# Patient Record
Sex: Female | Born: 1980 | Race: White | Hispanic: No | Marital: Married | Smoking: Never smoker
Health system: Southern US, Community
[De-identification: ages and names within clinical notes are randomized; demographics above are authoritative.]

## PROBLEM LIST (undated history)

## (undated) DIAGNOSIS — C50411 Malignant neoplasm of upper-outer quadrant of right female breast: Secondary | ICD-10-CM

## (undated) DIAGNOSIS — Z923 Personal history of irradiation: Secondary | ICD-10-CM

## (undated) DIAGNOSIS — L739 Follicular disorder, unspecified: Secondary | ICD-10-CM

## (undated) DIAGNOSIS — F411 Generalized anxiety disorder: Secondary | ICD-10-CM

## (undated) DIAGNOSIS — K529 Noninfective gastroenteritis and colitis, unspecified: Secondary | ICD-10-CM

## (undated) DIAGNOSIS — K635 Polyp of colon: Secondary | ICD-10-CM

## (undated) DIAGNOSIS — Z872 Personal history of diseases of the skin and subcutaneous tissue: Secondary | ICD-10-CM

## (undated) DIAGNOSIS — D179 Benign lipomatous neoplasm, unspecified: Secondary | ICD-10-CM

## (undated) DIAGNOSIS — D492 Neoplasm of unspecified behavior of bone, soft tissue, and skin: Secondary | ICD-10-CM

## (undated) DIAGNOSIS — N83209 Unspecified ovarian cyst, unspecified side: Secondary | ICD-10-CM

## (undated) HISTORY — DX: Follicular disorder, unspecified: L73.9

## (undated) HISTORY — DX: Generalized anxiety disorder: F41.1

## (undated) HISTORY — DX: Neoplasm of unspecified behavior of bone, soft tissue, and skin: D49.2

## (undated) HISTORY — DX: Personal history of diseases of the skin and subcutaneous tissue: Z87.2

## (undated) HISTORY — DX: Malignant neoplasm of upper-outer quadrant of right female breast: C50.411

## (undated) HISTORY — DX: Unspecified ovarian cyst, unspecified side: N83.209

## (undated) HISTORY — DX: Noninfective gastroenteritis and colitis, unspecified: K52.9

## (undated) HISTORY — DX: Polyp of colon: K63.5

## (undated) HISTORY — PX: BREAST LUMPECTOMY: SHX2

## (undated) HISTORY — DX: Benign lipomatous neoplasm, unspecified: D17.9

---
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
## 1998-08-06 DIAGNOSIS — N83209 Unspecified ovarian cyst, unspecified side: Secondary | ICD-10-CM

## 1998-08-06 HISTORY — DX: Unspecified ovarian cyst, unspecified side: N83.209

## 1998-10-08 ENCOUNTER — Emergency Department (HOSPITAL_COMMUNITY): Admission: EM | Admit: 1998-10-08 | Discharge: 1998-10-08 | Payer: Self-pay | Admitting: Emergency Medicine

## 1998-10-09 ENCOUNTER — Encounter: Payer: Self-pay | Admitting: Emergency Medicine

## 1998-10-12 ENCOUNTER — Ambulatory Visit (HOSPITAL_COMMUNITY): Admission: RE | Admit: 1998-10-12 | Discharge: 1998-10-12 | Payer: Self-pay | Admitting: Gastroenterology

## 1998-12-28 ENCOUNTER — Ambulatory Visit (HOSPITAL_COMMUNITY): Admission: RE | Admit: 1998-12-28 | Discharge: 1998-12-28 | Payer: Self-pay | Admitting: Gastroenterology

## 1999-01-05 HISTORY — PX: COLONOSCOPY: SHX174

## 1999-01-25 ENCOUNTER — Emergency Department (HOSPITAL_COMMUNITY): Admission: EM | Admit: 1999-01-25 | Discharge: 1999-01-25 | Payer: Self-pay | Admitting: Emergency Medicine

## 1999-01-26 ENCOUNTER — Encounter: Payer: Self-pay | Admitting: Gastroenterology

## 1999-01-26 ENCOUNTER — Ambulatory Visit (HOSPITAL_COMMUNITY): Admission: RE | Admit: 1999-01-26 | Discharge: 1999-01-26 | Payer: Self-pay | Admitting: Gastroenterology

## 1999-02-04 HISTORY — PX: ESOPHAGOGASTRODUODENOSCOPY: SHX1529

## 1999-02-06 ENCOUNTER — Ambulatory Visit (HOSPITAL_COMMUNITY): Admission: RE | Admit: 1999-02-06 | Discharge: 1999-02-06 | Payer: Self-pay | Admitting: Gastroenterology

## 1999-02-06 ENCOUNTER — Encounter (INDEPENDENT_AMBULATORY_CARE_PROVIDER_SITE_OTHER): Payer: Self-pay | Admitting: Specialist

## 1999-03-15 ENCOUNTER — Ambulatory Visit (HOSPITAL_COMMUNITY): Admission: RE | Admit: 1999-03-15 | Discharge: 1999-03-15 | Payer: Self-pay | Admitting: Gastroenterology

## 1999-03-15 ENCOUNTER — Encounter: Payer: Self-pay | Admitting: Gastroenterology

## 1999-10-05 HISTORY — PX: FLEXIBLE SIGMOIDOSCOPY: SHX1649

## 1999-10-07 ENCOUNTER — Emergency Department (HOSPITAL_COMMUNITY): Admission: EM | Admit: 1999-10-07 | Discharge: 1999-10-07 | Payer: Self-pay | Admitting: *Deleted

## 2000-06-11 ENCOUNTER — Emergency Department (HOSPITAL_COMMUNITY): Admission: EM | Admit: 2000-06-11 | Discharge: 2000-06-11 | Payer: Self-pay | Admitting: Emergency Medicine

## 2000-06-11 ENCOUNTER — Encounter: Payer: Self-pay | Admitting: Emergency Medicine

## 2001-01-14 ENCOUNTER — Emergency Department (HOSPITAL_COMMUNITY): Admission: EM | Admit: 2001-01-14 | Discharge: 2001-01-14 | Payer: Self-pay | Admitting: Emergency Medicine

## 2001-07-04 ENCOUNTER — Other Ambulatory Visit: Admission: RE | Admit: 2001-07-04 | Discharge: 2001-07-04 | Payer: Self-pay | Admitting: Obstetrics and Gynecology

## 2001-10-02 ENCOUNTER — Encounter: Payer: Self-pay | Admitting: Gastroenterology

## 2001-10-02 ENCOUNTER — Encounter: Admission: RE | Admit: 2001-10-02 | Discharge: 2001-10-02 | Payer: Self-pay | Admitting: Gastroenterology

## 2001-10-03 ENCOUNTER — Encounter (INDEPENDENT_AMBULATORY_CARE_PROVIDER_SITE_OTHER): Payer: Self-pay | Admitting: Specialist

## 2001-10-03 ENCOUNTER — Ambulatory Visit (HOSPITAL_COMMUNITY): Admission: RE | Admit: 2001-10-03 | Discharge: 2001-10-03 | Payer: Self-pay | Admitting: Gastroenterology

## 2002-08-06 ENCOUNTER — Encounter: Payer: Self-pay | Admitting: Internal Medicine

## 2002-08-06 ENCOUNTER — Inpatient Hospital Stay (HOSPITAL_COMMUNITY): Admission: EM | Admit: 2002-08-06 | Discharge: 2002-08-08 | Payer: Self-pay | Admitting: Emergency Medicine

## 2002-08-07 ENCOUNTER — Encounter: Payer: Self-pay | Admitting: Internal Medicine

## 2002-10-07 ENCOUNTER — Other Ambulatory Visit: Admission: RE | Admit: 2002-10-07 | Discharge: 2002-10-07 | Payer: Self-pay | Admitting: Obstetrics and Gynecology

## 2004-01-21 ENCOUNTER — Other Ambulatory Visit: Admission: RE | Admit: 2004-01-21 | Discharge: 2004-01-21 | Payer: Self-pay | Admitting: Obstetrics and Gynecology

## 2004-10-16 ENCOUNTER — Ambulatory Visit: Payer: Self-pay | Admitting: Family Medicine

## 2005-01-24 ENCOUNTER — Other Ambulatory Visit: Admission: RE | Admit: 2005-01-24 | Discharge: 2005-01-24 | Payer: Self-pay | Admitting: Obstetrics and Gynecology

## 2006-01-02 ENCOUNTER — Ambulatory Visit: Payer: Self-pay | Admitting: Family Medicine

## 2006-01-14 ENCOUNTER — Ambulatory Visit: Payer: Self-pay | Admitting: Family Medicine

## 2006-02-05 ENCOUNTER — Other Ambulatory Visit: Admission: RE | Admit: 2006-02-05 | Discharge: 2006-02-05 | Payer: Self-pay | Admitting: Obstetrics and Gynecology

## 2006-06-20 ENCOUNTER — Ambulatory Visit: Payer: Self-pay | Admitting: Family Medicine

## 2006-08-14 ENCOUNTER — Ambulatory Visit: Payer: Self-pay | Admitting: Family Medicine

## 2006-08-15 ENCOUNTER — Encounter: Payer: Self-pay | Admitting: Family Medicine

## 2006-10-05 DIAGNOSIS — L739 Follicular disorder, unspecified: Secondary | ICD-10-CM

## 2006-10-05 HISTORY — DX: Follicular disorder, unspecified: L73.9

## 2006-10-20 ENCOUNTER — Inpatient Hospital Stay: Payer: Self-pay | Admitting: Internal Medicine

## 2006-10-20 ENCOUNTER — Other Ambulatory Visit: Payer: Self-pay

## 2006-10-23 ENCOUNTER — Ambulatory Visit: Payer: Self-pay | Admitting: Internal Medicine

## 2006-11-11 ENCOUNTER — Ambulatory Visit: Payer: Self-pay | Admitting: Family Medicine

## 2006-11-12 ENCOUNTER — Encounter: Payer: Self-pay | Admitting: Family Medicine

## 2007-03-07 ENCOUNTER — Ambulatory Visit: Payer: Self-pay | Admitting: Family Medicine

## 2007-03-07 DIAGNOSIS — D649 Anemia, unspecified: Secondary | ICD-10-CM

## 2007-03-07 DIAGNOSIS — K5289 Other specified noninfective gastroenteritis and colitis: Secondary | ICD-10-CM | POA: Insufficient documentation

## 2007-03-07 DIAGNOSIS — F411 Generalized anxiety disorder: Secondary | ICD-10-CM | POA: Insufficient documentation

## 2007-03-07 DIAGNOSIS — L708 Other acne: Secondary | ICD-10-CM | POA: Insufficient documentation

## 2007-03-07 LAB — CONVERTED CEMR LAB
Glucose, Urine, Semiquant: NEGATIVE
Nitrite: NEGATIVE
Specific Gravity, Urine: 1.015
Urobilinogen, UA: 0.2
pH: 6.5

## 2007-03-17 ENCOUNTER — Telehealth (INDEPENDENT_AMBULATORY_CARE_PROVIDER_SITE_OTHER): Payer: Self-pay | Admitting: *Deleted

## 2007-04-05 ENCOUNTER — Inpatient Hospital Stay (HOSPITAL_COMMUNITY): Admission: AD | Admit: 2007-04-05 | Discharge: 2007-04-06 | Payer: Self-pay | Admitting: Obstetrics and Gynecology

## 2007-04-15 ENCOUNTER — Ambulatory Visit: Payer: Self-pay | Admitting: Family Medicine

## 2007-04-16 ENCOUNTER — Encounter: Payer: Self-pay | Admitting: Family Medicine

## 2007-05-14 ENCOUNTER — Ambulatory Visit: Payer: Self-pay | Admitting: Infectious Diseases

## 2007-05-14 ENCOUNTER — Inpatient Hospital Stay (HOSPITAL_COMMUNITY): Admission: AD | Admit: 2007-05-14 | Discharge: 2007-05-17 | Payer: Self-pay | Admitting: Obstetrics and Gynecology

## 2007-05-17 ENCOUNTER — Encounter: Payer: Self-pay | Admitting: Infectious Diseases

## 2007-05-19 ENCOUNTER — Inpatient Hospital Stay (HOSPITAL_COMMUNITY): Admission: AD | Admit: 2007-05-19 | Discharge: 2007-05-19 | Payer: Self-pay | Admitting: Obstetrics and Gynecology

## 2007-05-26 ENCOUNTER — Telehealth: Payer: Self-pay | Admitting: Infectious Diseases

## 2007-08-22 ENCOUNTER — Inpatient Hospital Stay (HOSPITAL_COMMUNITY): Admission: AD | Admit: 2007-08-22 | Discharge: 2007-08-23 | Payer: Self-pay | Admitting: Obstetrics and Gynecology

## 2007-10-09 ENCOUNTER — Observation Stay (HOSPITAL_COMMUNITY): Admission: AD | Admit: 2007-10-09 | Discharge: 2007-10-10 | Payer: Self-pay | Admitting: Obstetrics and Gynecology

## 2007-11-06 ENCOUNTER — Inpatient Hospital Stay (HOSPITAL_COMMUNITY): Admission: AD | Admit: 2007-11-06 | Discharge: 2007-11-09 | Payer: Self-pay | Admitting: Obstetrics and Gynecology

## 2007-11-10 ENCOUNTER — Inpatient Hospital Stay (HOSPITAL_COMMUNITY): Admission: AD | Admit: 2007-11-10 | Discharge: 2007-11-11 | Payer: Self-pay | Admitting: Obstetrics and Gynecology

## 2008-01-20 ENCOUNTER — Other Ambulatory Visit: Payer: Self-pay

## 2008-01-20 ENCOUNTER — Emergency Department: Payer: Self-pay | Admitting: Emergency Medicine

## 2008-02-16 ENCOUNTER — Telehealth: Payer: Self-pay | Admitting: Family Medicine

## 2008-08-13 ENCOUNTER — Ambulatory Visit: Payer: Self-pay | Admitting: Family Medicine

## 2008-11-23 ENCOUNTER — Ambulatory Visit: Payer: Self-pay | Admitting: Family Medicine

## 2008-11-23 ENCOUNTER — Telehealth: Payer: Self-pay | Admitting: Family Medicine

## 2008-11-23 DIAGNOSIS — Z7721 Contact with and (suspected) exposure to potentially hazardous body fluids: Secondary | ICD-10-CM

## 2008-11-26 LAB — CONVERTED CEMR LAB
Basophils Absolute: 0 10*3/uL (ref 0.0–0.1)
Hemoglobin: 13.5 g/dL (ref 12.0–15.0)
Lymphocytes Relative: 33.9 % (ref 12.0–46.0)
MCV: 86.9 fL (ref 78.0–100.0)
Monocytes Absolute: 0.3 10*3/uL (ref 0.1–1.0)
Neutro Abs: 2.1 10*3/uL (ref 1.4–7.7)
Neutrophils Relative %: 55.6 % (ref 43.0–77.0)
Platelets: 250 10*3/uL (ref 150.0–400.0)
RBC: 4.41 M/uL (ref 3.87–5.11)
RDW: 13.3 % (ref 11.5–14.6)

## 2009-01-03 IMAGING — US US OB COMP LESS 14 WK
1 series · 14 of 28 positions shown · non-contrast
Comparison: none

HISTORY: Early pregnancy, spotting

[Series 1: us ob comp less 14 wk · 0.18mm/px · 14 of 39 slices shown]
[im 2/39]
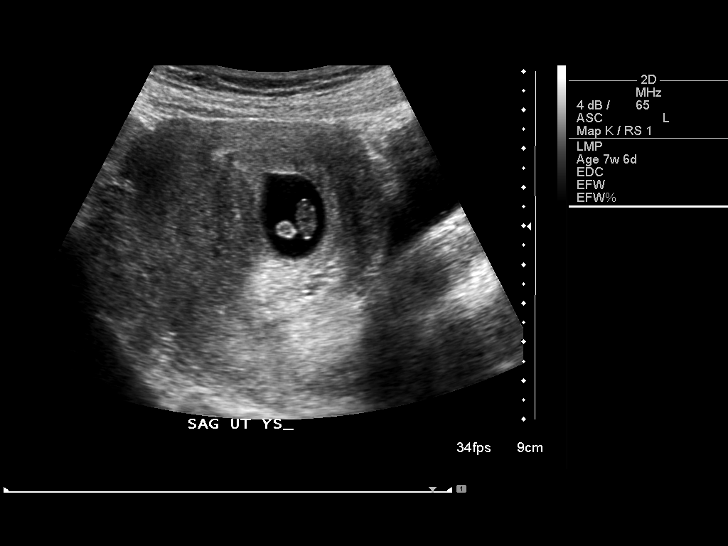
[im 5/39]
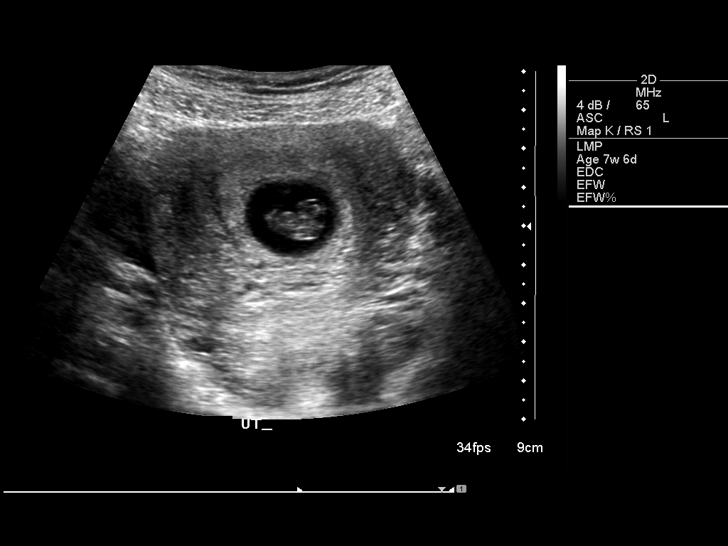
[im 8/39]
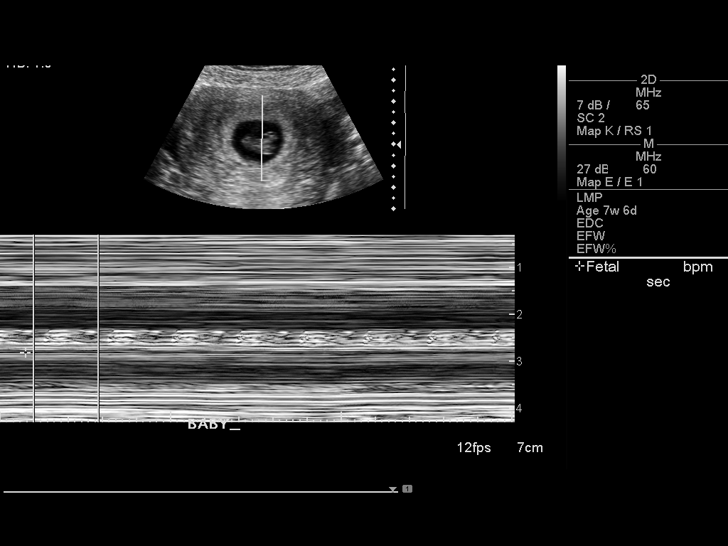
[im 10/39]
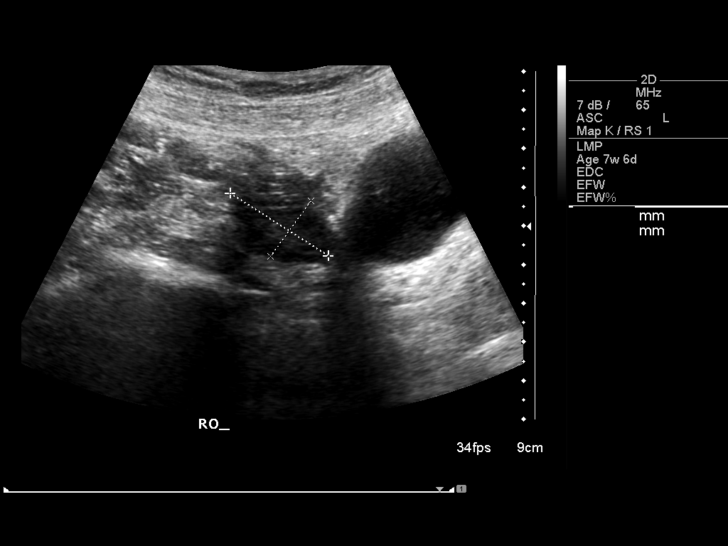
[im 13/39]
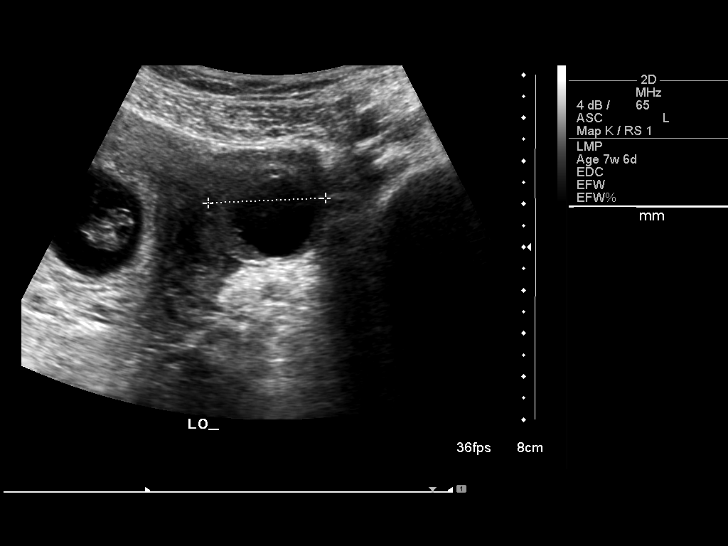
[im 16/39]
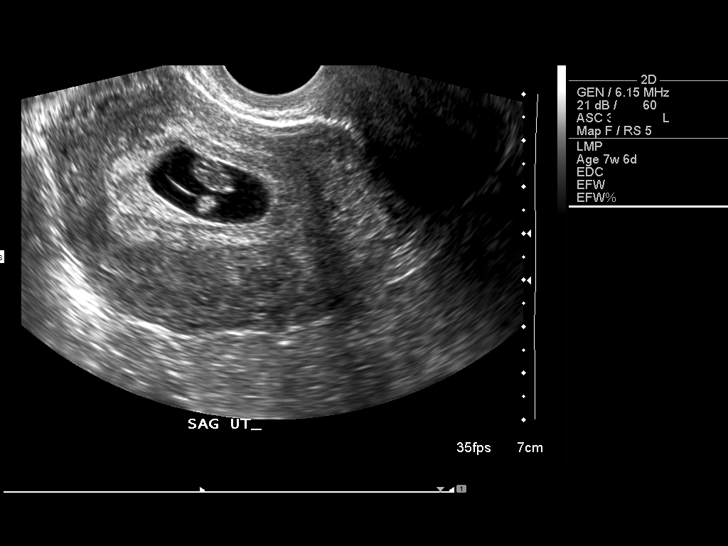
[im 19/39]
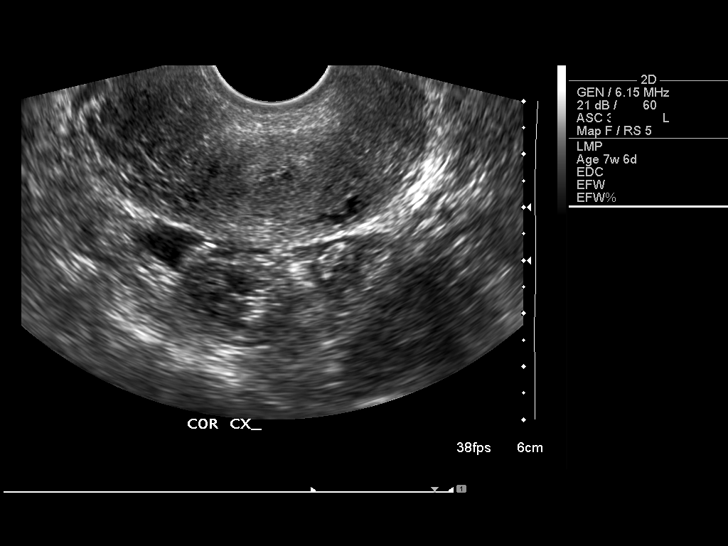
[im 22/39]
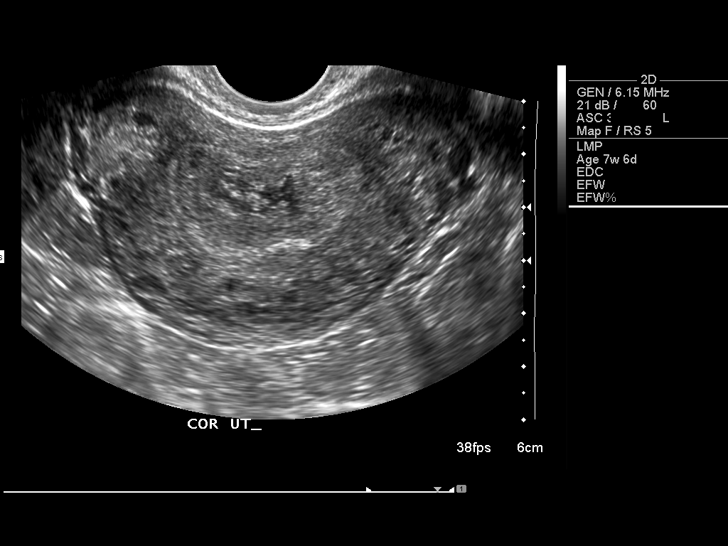
[im 24/39]
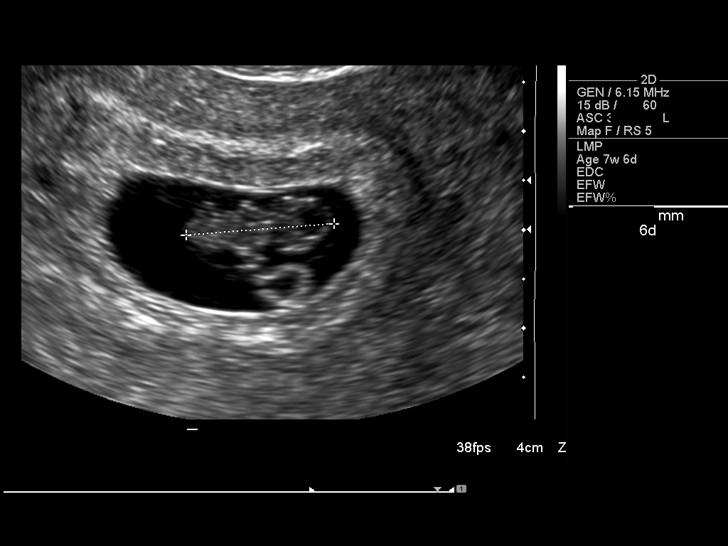
[im 27/39]
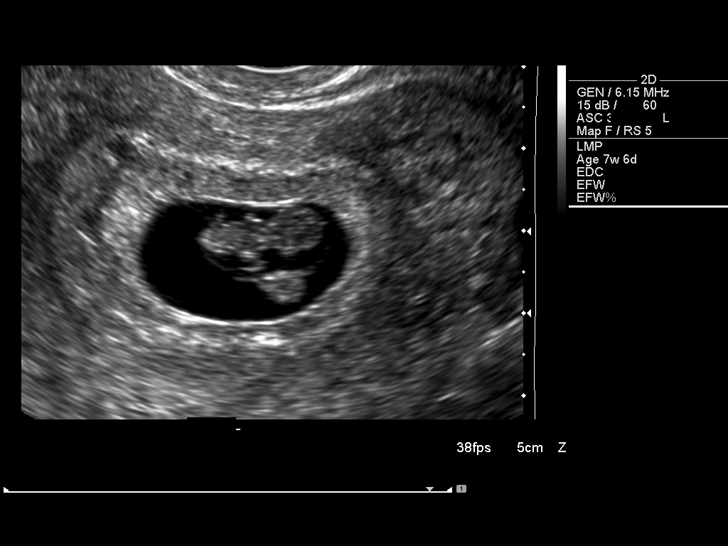
[im 30/39]
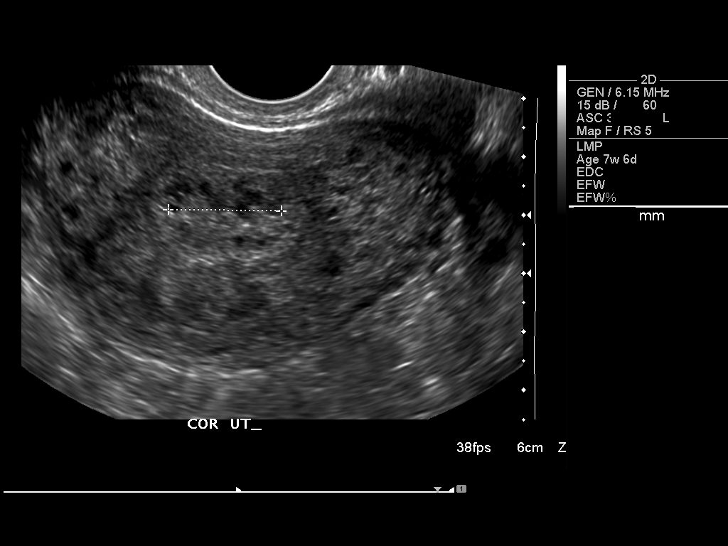
[im 33/39]
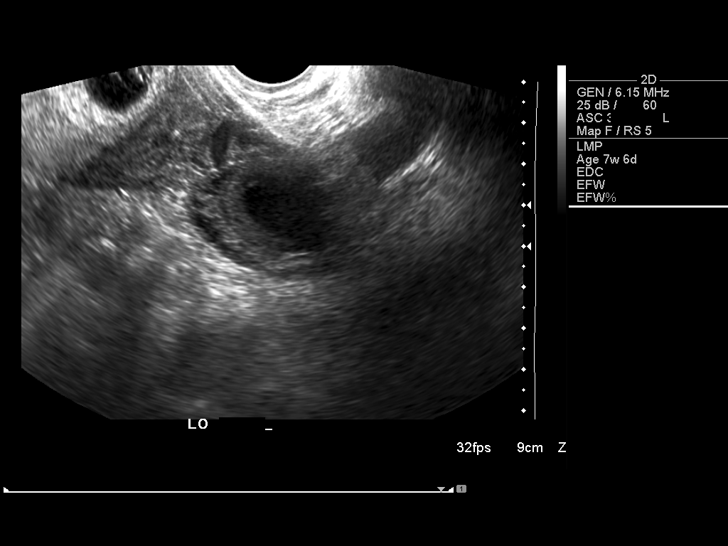
[im 36/39]
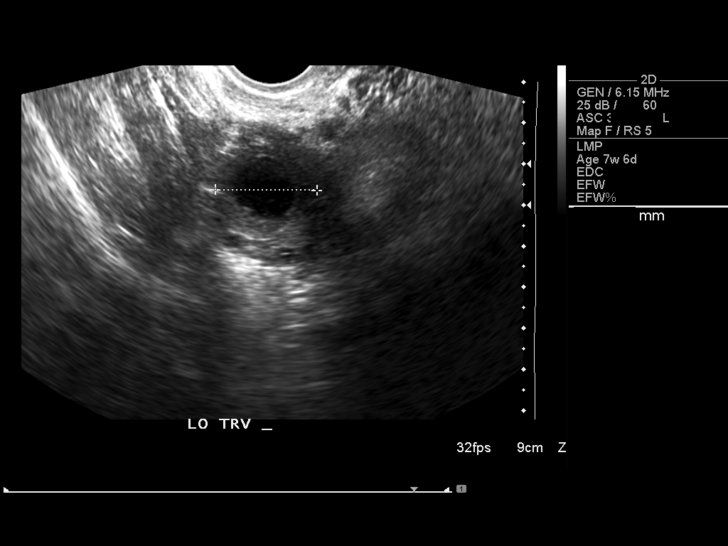
[im 39/39]
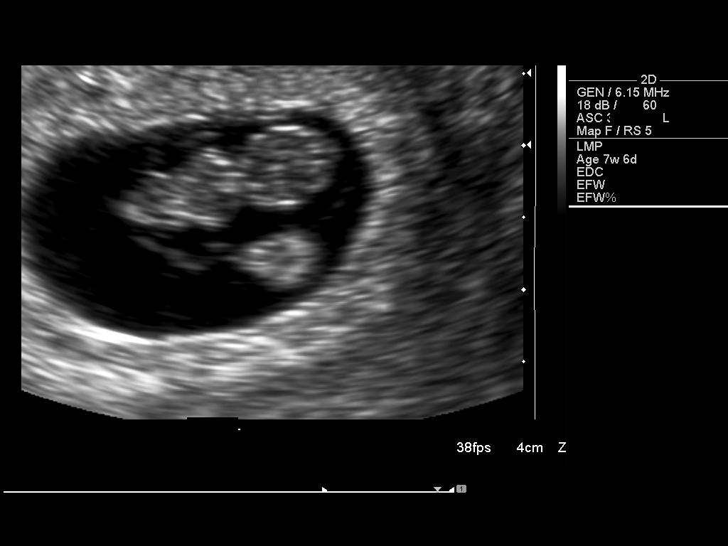

[14 of 28 positions shown; findings below may reference images not displayed]

ULTRASOUND OB COMPLETE LESS THAN 14 WEEKS:
ULTRASOUND OB TRANSVAGINAL MODIFY:

Transabdominal and endovaginal sonography of the gravid pelvis performed.

Gestational sac seen within uterus containing fetal pole and yolk sac.
Crown-rump length 15.6 mm corresponding to 8 weeks 0 days EGA.
Small subchorionic hemorrhage identified
Fetal cardiac activity detected at 158 beats per minute.
Right ovary normal size and morphology, 3.2 x 1.8 x 2.9 cm.
Left ovary 4.2 x 2.9 x 2.5 cm, containing small corpus luteal cyst.
No free pelvic fluid or adnexal mass.
IMPRESSION: Single live early intrauterine gestation measured at 8 weeks 0 days EGA by
crown-rump length.
Small subchorionic hemorrhage.

## 2009-09-06 ENCOUNTER — Telehealth: Payer: Self-pay | Admitting: Family Medicine

## 2010-02-03 ENCOUNTER — Ambulatory Visit: Payer: Self-pay | Admitting: Family Medicine

## 2010-02-03 DIAGNOSIS — R21 Rash and other nonspecific skin eruption: Secondary | ICD-10-CM

## 2010-09-04 ENCOUNTER — Ambulatory Visit
Admission: RE | Admit: 2010-09-04 | Discharge: 2010-09-04 | Payer: Self-pay | Source: Home / Self Care | Attending: Family Medicine | Admitting: Family Medicine

## 2010-09-04 DIAGNOSIS — D179 Benign lipomatous neoplasm, unspecified: Secondary | ICD-10-CM | POA: Insufficient documentation

## 2010-09-05 ENCOUNTER — Ambulatory Visit
Admission: RE | Admit: 2010-09-05 | Discharge: 2010-09-05 | Payer: Self-pay | Source: Home / Self Care | Attending: Family Medicine | Admitting: Family Medicine

## 2010-09-05 ENCOUNTER — Encounter: Payer: Self-pay | Admitting: Family Medicine

## 2010-09-05 DIAGNOSIS — D492 Neoplasm of unspecified behavior of bone, soft tissue, and skin: Secondary | ICD-10-CM | POA: Insufficient documentation

## 2010-09-06 ENCOUNTER — Encounter: Payer: Self-pay | Admitting: Family Medicine

## 2010-09-06 ENCOUNTER — Ambulatory Visit: Payer: Self-pay | Admitting: Family Medicine

## 2010-09-06 DIAGNOSIS — R609 Edema, unspecified: Secondary | ICD-10-CM | POA: Insufficient documentation

## 2010-09-07 NOTE — Progress Notes (Signed)
Summary: ?flu N&V  Phone Note Call from Patient Call back at [phone]   Caller: Patient Call For: Melissa Part MD Summary of Call: Pt left v/m that she has the flu, nausea and vomiting, can't eat or drink. I called pt and left v/m to please call me back. Initial call taken by: Lewanda Rife LPN,  September 06, 2009 2:42 PM  Follow-up for Phone Call        Pt was exposed to flu(pt's family has had confirmed flu).Pt has been sick one wk. Productive cough with green mucus,ST, fever 101-103 pt rotating Tylenol and Advil and temp now is normal.Last vomitied 11am this morning. still nauseate but is able to drink and keep down sprite and water. Occasional diarrhea. Ate small amt for lunch and kept that down but still nauseated. Pt feels weak. Pt taking mucinex D, tylenol or advil and pt had liquid Augmentin and was taking 11ml not sure of mg. Pt stopped because thought that might upset stomach. Pt feels a little better today and wondered if could get Phenergan called in and see how she does. Pt would rather not come in if did not have to. Pt uses [facility] [address]. Please advise.  Follow-up by: Lewanda Rife LPN,  September 06, 2009 3:42 PM  Additional Follow-up for Phone Call Additional follow up Details #1::        ok to try some phenergan -- watch out of sedation if abd pain or return of fever - update me and f/u if not better in several days f/u please px written on EMR for call in  her record shows adv rxn to phenergan in past-- but only local rxn with injection only-- please confirm that she has no prev prob with oral phenergan Additional Follow-up by: Melissa Part MD,  September 06, 2009 3:49 PM    Additional Follow-up for Phone Call Additional follow up Details #2::    Rx called in to [facility], left message on patient's cell phone voicemail, advised as instructed.  Also asked her to return call.  Linde Gillis CMA Duncan Dull)  September 06, 2009 4:38 PM   Message left for pt  this morning to call back with allergy information.  She has not called back yet.            Lowella Petties CMA  September 07, 2009 2:43 PM  Pt called stating that she is not allergic to oral phenergan and feels much better now. Follow-up by: Lowella Petties CMA,  September 07, 2009 3:56 PM  New/Updated Medications: PROMETHAZINE HCL 25 MG TABS (PROMETHAZINE HCL) 1 by mouth up to three times a day as needed nausea Prescriptions: PROMETHAZINE HCL 25 MG TABS (PROMETHAZINE HCL) 1 by mouth up to three times a day as needed nausea  #10 x 0   Entered and Authorized by:   Melissa Part MD   Signed by:   Melissa Part MD on 09/06/2009   Method used:   Telephoned to ...         RxID:   [id number]

## 2010-09-07 NOTE — Assessment & Plan Note (Signed)
Summary: ACUTE BUMPS ON ARMS AND THIGH  CYD   Vital Signs:  Patient profile:   30 year old female Height:      65 inches Weight:      139.2 pounds BMI:     23.25 Temp:     99.0 degrees F Pulse rate:   80 / minute Pulse rhythm:   regular BP sitting:   90 / 60  (left arm) Cuff size:   regular  Vitals Entered By: Benny Lennert CMA Duncan Dull) (February 03, 2010 4:27 PM) CC: rash on body   History of Present Illness: 4 days ago noted onset on rash on back.,.Marland Kitchenafter pulling weeds in yard. Next AM noted bumps on feet...more amd more spread to arms. Small reddish bumps, no blisters.   Fever today 100. Feels tired and mild diarrhea today.  Very itchy...most at night.  HAs been puting alcholol and fingernail poilish on them.   No antihistamines.  No sick contacts. No new meds, no new detergent, no new creams.  Problems Prior to Update: 1)  Exposure To Hazardous Body Fluid, Hx of  (ICD-V15.85) 2)  Anxiety State Nos  (ICD-300.00) 3)  Hx of Acne Vulgaris  (ICD-706.1) 4)  Hx of Colitis  (ICD-558.9) 5)  Anemia-nos  (ICD-285.9)  Current Medications (verified): 1)  Tri-Sprintec 0.035 Mg Tabs (Norgestimate-Ethinyl Estradiol) .... One By Mouth Daily As Directed 2)  Triamcinolone Acetonide 0.5 % Crea (Triamcinolone Acetonide) .... Aaa Two Times A Day X 2 Weeks  Allergies: 1)  Erythromycin 2)  Lidocaine 3)  Phenergan 4)  Zithromax 5)  * Ferrous Sulfate 6)  Codeine  Past History:  Past medical, surgical, family and social histories (including risk factors) reviewed, and no changes noted (except as noted below).  Past Medical History: Reviewed history from 08/13/2008 and no changes required. Anemia-NOS hx of colitis acne   Past Surgical History: Reviewed history from 03/07/2007 and no changes required. Tonsillectomy (1998) CT abd- adnexal mass, ovarian cyst (2000) Colonoscopy- few polyps- bx neg (01/1999) EGD- neg (02/1999) Flex sig- neg (10/1999) Hosp- urticaria  (08/2002) Hosp- folliculitis  (pseudomonas), anemia (10/2006)  Family History: Reviewed history and no changes required.  Social History: Reviewed history from 11/23/2008 and no changes required. Marital Status: Married Children: 1 Occupation:   Review of Systems General:  Complains of malaise. CV:  Denies chest pain or discomfort. Resp:  Denies shortness of breath.  Physical Exam  General:  Well-developed,well-nourished,in no acute distress; alert,appropriate and cooperative throughout examination Eyes:  No corneal or conjunctival inflammation noted. EOMI. Perrla. Funduscopic exam benign, without hemorrhages, exudates or papilledema. Vision grossly normal. Ears:  External ear exam shows no significant lesions or deformities.  Otoscopic examination reveals clear canals, tympanic membranes are intact bilaterally without bulging, retraction, inflammation or discharge. Hearing is grossly normal bilaterally. Nose:  External nasal examination shows no deformity or inflammation. Nasal mucosa are pink and moist without lesions or exudates. Mouth:  Oral mucosa and oropharynx without lesions or exudates.  Teeth in good repair. Neck:  no carotid bruit or thyromegaly no cervical or supraclavicular lymphadenopathy  Lungs:  Normal respiratory effort, chest expands symmetrically. Lungs are clear to auscultation, no crackles or wheezes. Heart:  Normal rate and regular rhythm. S1 and S2 normal without gallop, murmur, click, rub or other extra sounds. Skin:  several small clusters of erythematous papules on arms leg and torso   Impression & Recommendations:  Problem # 1:  SKIN RASH (ICD-782.1) Most likely contact dermatitis, unclear triggger. Low grade temperature  may suggest viral source, but rash ois not consistent. . Treat with topical steroid and antihistamines.  Her updated medication list for this problem includes:    Triamcinolone Acetonide 0.5 % Crea (Triamcinolone acetonide) .Marland Kitchen... Aaa two  times a day x 2 weeks  Complete Medication List: 1)  Tri-sprintec 0.035 Mg Tabs (Norgestimate-ethinyl estradiol) .... One by mouth daily as directed 2)  Triamcinolone Acetonide 0.5 % Crea (Triamcinolone acetonide) .... Aaa two times a day x 2 weeks  Patient Instructions: 1)  Benadryl at night for sleep. 2)  Claritin during day for itching. 3)  Apply topical steroid cream two times a day x 2 weeks.  Prescriptions: TRIAMCINOLONE ACETONIDE 0.5 % CREA (TRIAMCINOLONE ACETONIDE) AAA two times a day x 2 weeks  #15-30 gm x 0   Entered and Authorized by:   Kerby Nora MD   Signed by:   Kerby Nora MD on 02/03/2010   Method used:   Electronically to        [facility] [address] (retail)       [address]       [place], [place]  [address]       Ph: [phone]       Fax: [phone]   RxID:   [id number]   Current Allergies (reviewed today): ERYTHROMYCIN LIDOCAINE PHENERGAN ZITHROMAX * FERROUS SULFATE CODEINE

## 2010-09-13 NOTE — Assessment & Plan Note (Signed)
Summary: Recheck leg-per Dr. G//kad   Vital Signs:  Patient profile:   30 year old female Weight:      135.50 pounds Temp:     98.4 degrees F oral Pulse rate:   104 / minute Pulse rhythm:   regular BP sitting:   138 / 80  (left arm) Cuff size:   regular  Vitals Entered By: Selena Batten Dance CMA Duncan Dull) (September 05, 2010 4:53 PM) CC: Discuss U/S   History of Present Illness: CC: f/u US results  Pt returns today extremely worried about "prominent vein" noted on ultrasound.  wants furthe discussion as to what this could be.  still denies injury/trauma to leg.  last visit-  7 years ago s/p [place] R thigh, told fatty mass.  had massage last week, noticing swelling more as well as thinks it's grown, also noticing 2nd spot medial and proximal to first spot.  tender, bothersome.  worrying alot about this change.  flies to [employer] (2x/mo) - wants to make sure it's not a blood clot.  does walk plane a few times during long flights.    Allergies: 1)  Erythromycin 2)  Lidocaine 3)  Phenergan 4)  Zithromax 5)  * Ferrous Sulfate 6)  Codeine  Review of Systems       per HPI  Physical Exam  General:  Well-developed,well-nourished,in no acute distress; alert,appropriate and cooperative throughout examination Msk:  R anterior thigh at subcutaneous tissue level small tender nodule, somewhat mobile.  about 1cm in diameter.  also 2nd smaller (mms) lump medial/proximal to first Psych:  in tears, anxious about finding.   Impression & Recommendations:  Problem # 1:  NEOPLASMS UNSPEC NATURE BONE SOFT TISSUE&SKIN (ICD-239.2) doesn't feel like typical lipoma, rather like inflammed cyst/nodule/scar tissue.  "prominent vein" at site but patent per [place] report.  ? noninfectious phlebitis?  discussed with patient ,tried to reassure.  rec NSAIDs, cold compress for now.  plan is MRI tomorrow, appt with surgery on Friday.  If more vein issue, may need eval by vasc surgery  Orders: No Charge Patient  Arrived (NCPA0) (NCPA0)  Problem # 2:  LIPOMA (ICD-214.9)  Complete Medication List: 1)  Tri-sprintec 0.035 Mg Tabs (Norgestimate-ethinyl estradiol) .... One by mouth daily as directed   Orders Added: 1)  No Charge Patient Arrived (NCPA0) [NCPA0]    Current Allergies (reviewed today): ERYTHROMYCIN LIDOCAINE PHENERGAN ZITHROMAX * FERROUS SULFATE CODEINE

## 2010-09-13 NOTE — Assessment & Plan Note (Signed)
Summary: knot on leg/alc   Vital Signs:  Patient profile:   30 year old female Weight:      135.50 pounds Temp:     98.2 degrees F oral Pulse rate:   78 / minute Pulse rhythm:   regular BP sitting:   116 / 72  (left arm) Cuff size:   regular  Vitals Entered By: Selena Batten Dance CMA Duncan Dull) (September 04, 2010 3:55 PM) CC: Check knot on Right leg Comments Patient is seeing Dr. Barbaraann Cao at [facility] on 09-08-10. [phone] phone and [phone] fax   History of Present Illness: CC: check knot R leg  7 years ago s/p [place] R thigh, told fatty mass.  had massage last week, noticing swelling more as well as thinks it's grown, also noticing 2nd spot medial to first spot.  tender, bothersome.  worrying alot about this change.  flies to [employer] (2x/mo) - wants to make sure it's not a blood clot.  does walk plane a few times during long flights.  Allergies: 1)  Erythromycin 2)  Lidocaine 3)  Phenergan 4)  Zithromax 5)  * Ferrous Sulfate 6)  Codeine  Past History:  Past Medical History: Last updated: 08/13/2008 Anemia-NOS hx of colitis acne   Social History: Last updated: 11/23/2008 Marital Status: Married Children: 1 Occupation:   Review of Systems       per HPI  Physical Exam  General:  Well-developed,well-nourished,in no acute distress; alert,appropriate and cooperative throughout examination Msk:  R anterior thigh at subcutaneous tissue level small tendern nodule, somewhat mobile.  about 1cm in diameter.  also 2nd smaller (mms) lump medial/proximal to first Extremities:  no palpable cords.  no edema Skin:  Intact without suspicious lesions or rashes   Impression & Recommendations:  Problem # 1:  LIPOMA (ICD-214.9) refer back for [place] for changing soft tissue mass.  doesn;t feel like typical lipoma, rather like inflammed cyst/nodule/scar tissue.  pt already has appt with surgery for further evaluation, advised to keep, and to ask for records of [place] to take to  [facility].  Orders: Radiology Referral (Radiology)  Complete Medication List: 1)  Tri-sprintec 0.035 Mg Tabs (Norgestimate-ethinyl estradiol) .... One by mouth daily as directed  Patient Instructions: 1)  We will set you up with ultrasound. 2)  keep appointment with surgeon for Friday. 3)  Let us know if any questions!  good to meet you today.   Orders Added: 1)  Radiology Referral [Radiology] 2)  Est. Patient Level III [30865]    Current Allergies (reviewed today): ERYTHROMYCIN LIDOCAINE PHENERGAN ZITHROMAX * FERROUS SULFATE CODEINE

## 2010-12-19 NOTE — Discharge Summary (Signed)
Melissa Weiss, Melissa Weiss                ACCOUNT NO.:  [id number]   MEDICAL RECORD NO.:  [id number]          PATIENT TYPE:  INP   LOCATION:  9149                          FACILITY:  [facility]   PHYSICIAN:  Naima A. Dillard, M.D. DATE OF BIRTH:  02/24/1981   DATE OF ADMISSION:  10/09/2007  DATE OF DISCHARGE:  10/10/2007                               DISCHARGE SUMMARY   ADMITTING DIAGNOSES:  1. Intrauterine pregnancy at 34-5/7 weeks.  2. Nausea, occasional vomiting and Crohn's disease.  3. Preterm uterine contractions.   DISCHARGE DIAGNOSES:  1. Intrauterine pregnancy at 34-5/7 weeks.  2. Nausea, occasional vomiting and Crohn's disease.  3. Preterm uterine contractions.   PROCEDURES:  IV hydration.   HOSPITAL COURSE:  Ms. Lalli is a 30 year old gravida 1, para 0 at 34-4/7  weeks who presented from the office on October 09, 2007, with a complaint  of uterine contractions, lower abdominal pain, nausea with occasional  vomiting.  Her pregnancy had been remarkable for:  1. Crohn's disease.  2. History of HSV II  3. Facial MRSA.  4. First trimester bleeding.  5. History of pyelo and UTI with admission to the hospital for pyelo      during this pregnancy requiring hospital care.   On admission, vital signs were stable except for pulse rate of 114.  The  patient did appear to fill ill.  She is having some mild lower abdominal  tenderness.  Uterine contractions every 6 minutes with irritability  between.  Fetal heart rate is reactive with no decelerations.  Cervix  was closed and long.  The patient was noted to be significantly  dehydrated by urine examination.  She was given one dose of Ancef in  light of her history of pyelo and also recent positive urine cultures  for group B strep as well as enterococcus.  The patient did had had a  test of cure sent from the office culture on the day of admission.  She  also had further antiemetics.  She also to have gallbladder ultrasound  that showed mild to  moderate right hydronephrosis consistent with  pregnancy and mild intrahepatic hepatic biliary fullness with normal  common bile duct caliber.  There were no evidence of stones.  The  patient was given one dose of Procardia 30 mg which seemed to diminish  her contractions.  She was admitted overnight for IV hydration.  She  declined antiemetics and Fleet medication.  Her cervix did not change.  By the morning of October 10, 2007, the patient was feeling much better.  She was having no further nausea.  She was tolerating a regular diet.  Fetal heart rate was reactive.  She was having minimal contraction  activity.  She was seen by Dr. Normand Sloop and was deemed to receive full  benefit of her hospital stay and was discharged home.  During the  patient's hospital stay, she also had a social work consult for anxiety,  but was found to have no underlying disease.   DISCHARGE INSTRUCTIONS:  The patient is to continue to monitor for any  increase in  pain, nausea, vomiting or any other issues of premature  labor.  She will continue monitoring her Crohn's disease.  Discharge  follow-up will occur in one week at [facility] or p.r.n.      Renaldo Reel Emilee Hero, C.N.M.      Naima A. Normand Sloop, M.D.  Electronically Signed    VLL/MEDQ  D:  10/10/2007  T:  10/12/2007  Job:  [id number]

## 2010-12-19 NOTE — H&P (Signed)
NAMEDEANZA, UPPERMAN                ACCOUNT NO.:  [id number]   MEDICAL RECORD NO.:  [id number]          PATIENT TYPE:  INP   LOCATION:  9149                          FACILITY:  [facility]   PHYSICIAN:  Hal Morales, M.D.DATE OF BIRTH:  02/13/1981   DATE OF ADMISSION:  10/09/2007  DATE OF DISCHARGE:                              HISTORY & PHYSICAL   Ms. Cataldo is a 30 year old gravida 1, para 0, at 34-4/7 weeks, who  presented from the office for evaluation secondary to uterine  contractions on NST.  She has complained of decreased fetal movement and  lower abdominal pain.  NST was reactive but uterine contractions were  noted to be every 3 minutes on the monitor.  The patient was complaining  of just feeling bad.  She was nauseated, had low appetite this week.  No vomiting or diarrhea and also denied fever or chills.  She is aware  of uterine contraction that is painful but also complains of general  lower abdominal pain since Monday with symptoms of nausea.  She had not  eaten in 24 hours.  Cervix in the office was closed and long, -2 in the  office per Dr. Estanislado Pandy.  Pregnancy has been remarkable for:   1. Crohn disease.  2. History of HSV-2, no recent outbreaks.  3. History of facial MRSA.  4. First trimester bleeding.  5. History of pyelonephritis and UTI with pyelonephritis during this      pregnancy with hospitalization required on October 8.   PRENATAL LABS:  Blood type is A+, Rh antibody negative.  VDRL  nonreactive.  Rubella titer positive.  Hepatitis B surface antigen  negative.  HIV was nonreactive.  GC and chlamydia cultures were negative  in the first trimester.  Cystic fibrosis testing was negative.  First  trimester screening was normal.  She had MRSA on her face and was  treated several times.  AFP was normal.  Quad screen was done, not  intentionally, but did show an elevated risk of Down's.  First trimester  screen was normal.  Amniocentesis was not recommended.   Glucola was  normal.  Hemoglobin at 28 weeks was 12.2.  The patient had positive  group B strep noted on urine culture and was treated with penicillin VK  and a urinary culture was Enterococcus was treated with ampicillin.   HISTORY OF PRESENT PREGNANCY:  The patient entered care at approximately  10 weeks.  First trimester screen was done.  She had been seen by Dr.  Estanislado Pandy just prior to that for clindamycin treatment of MRSA infection on  her face.  She had episodes throughout her pregnancy of hematuria.  She  had pyelonephritis noted in October and was admitted overnight for  several days in the hospital for that.  At 15 weeks she had an  ultrasound showing normal growth.  She continued be followed for her  MRSA by Dr. Ninetta Lights.  She had another ultrasound at 18 weeks showing  normal growth.  She had a normal AFP but unintentionally had a quadruple  screen run as well, which showed an  elevated Down syndrome risk.  The  risk of first trimester screen was 1/15,000.  Amniocentesis was offered  but Dr. Stefano Gaul recommended the patient not to amniocentesis.  She had  some cramping at 19 weeks with some questionable bleeding.  It was  determined this probably was from the rectal area.  During pregnancy she  has had some issues with nausea and vomiting, abdominal pain.  She had  another ultrasound at 24 weeks showing normal growth and a repeat of  that was done at 28-29 weeks.  At 28 weeks she also had a episode of  racing heartbeat.  TSH was evaluated and was normal.  She had a Group B  strep culture done approximately 2-1/2 weeks ago that was positive as  she was treated for this to penicillin VK because it was a urine  culture.  Then she had had a previously subsequent urine culture that  showed Enterococcus, and this was treated with ampicillin   OBSTETRICAL HISTORY:  The patient is a primigravida.   MEDICAL HISTORY:  She is a previous oral contraceptive/Sprintec user.  She was diagnosed  with HSV-2 in the past.  She was diagnosed with GC in  2007 and it was treated.  She was diagnosed with MRSA on her face in  April 2008 and was treated.  She had sporadic UTIs.  She has had  pyelonephritis several times.  She does have a history of Crohn disease.  This was diagnosed in college.   SURGICAL HISTORY:  Wisdom teeth removed, tonsils removed, adenoids.   She has had several episodes of rectal bleeding.  She had a  sigmoidoscopy and endoscopy and colonoscopy related to Crohn's.  She had  a Pseudomonas encephalitis for 3 days and had fifth disease in the past.   FAMILY HISTORY:  Maternal grandfather had congestive heart failure and  quadruple bypass surgery.  Her father has angina.  Her mother also has  angina.  Her mother and father and maternal uncle have hypertension.  Her mother and maternal aunt have varicosities.  Her sister has asthma.  Her paternal grandmother had strokes and Alzheimer's.  Her grandfather  had Alzheimer's.  Her mother, maternal grandmother and maternal aunt and  the patient have all had anemia.  Paternal grandfather has a  nonfunctioning left kidney.  Paternal grandfather had prostate cancer.  Paternal grandfather had severe depression.  Paternal first cousin was  born with the heart outside the chest.   GENERAL:  Otherwise unremarkable.   SOCIAL HISTORY:  The patient is married to the father of the baby.  He  is involved and supportive.  His name is Falyn Rubel.  The patient is an  audiologist.  She has a bachelor's degree and is full-time employed.  Her husband has 1 year college.  He is a Production designer, theatre/television/film, Occupational hygienist.   The patient is allergic to CODEINE, ERYTHROMYCIN, DEMEROL, VICODIN,  which cause vomiting; LIDOCAINE and BENADRYL, which cause reaction.   The patient does any alcohol, drug or tobacco use during this pregnancy.  She is Caucasian and does not declare a religious affiliation.  She has  been followed by the physician service at  [facility].  She  denies any alcohol, drug or tobacco use during this pregnancy.   PHYSICAL EXAM:  VITAL SIGNS:  Stable.  The patient is afebrile.  Pulse is 114.  The patient did appear to feel  ill.  HEART:  Regular rate and rhythm without murmur.  BREASTS:  Soft and  nontender.  ABDOMEN:  Fundal height is approximately 34 cm.  Abdomen is gravid with  mild tenderness.  Fetal heart rate was reactive on admission with no  decelerations.  Uterine contractions are every 6 minutes with  irritability between.  Cervix was closed and long.  Fetal fibronectin was deferred by Dr.  Estanislado Pandy in the office.  EXTREMITIES:  Deep to reflexes are 2+ without clonus.  There is a trace  edema noted.   UA shows specific gravity 1.010, moderate hemoglobin, ketones 40 and  small leukocyte esterase.  Urine was sent for culture.  The patient  received a dose of Ancef 2 g IV in MAU and Zofran.  CBC showed  hemoglobin of 12.2, white blood cell count of 9.4 and platelet count of  169.  Comprehensive metabolic panel was within normal limits.   The patient received one dose of 30 mg of Procardia in maternity  admissions with diminishing of contractions.  Cervix was rechecked and  had no change.  Pulse remained at 122.  The decision was made to admit  for 23-hour observation, IV hydration and medication for nausea.   ASSESSMENT:  1. Intrauterine pregnancy at 34-4/7 weeks.  2. Crohn disease.  3. Preterm uterine contractions without cervical change..  4. Nausea and abdominal pain.   PLAN:  1. Admit to antenatal per consult with Dr. Pennie Rushing as attending      physician.  2. IV hydration.  3. Zofran IV q.8 h. p.r.n.  4. The patient was offered and recommended Ambien; however, she      declined.  5. Procardia will be given if necessary for contraction control.  6. Social work consult in the morning per Dr. Cloretta Ned recommendation      secondary to anxiety.      Renaldo Reel Emilee Hero, C.N.M.       Hal Morales, M.D.  Electronically Signed    VLL/MEDQ  D:  10/10/2007  T:  10/10/2007  Job:  [id number]

## 2010-12-19 NOTE — Discharge Summary (Signed)
[name], [name]                ACCOUNT NO.:  [id number]   MEDICAL RECORD NO.:  [id number]          PATIENT TYPE:  INP   LOCATION:  9320                          FACILITY:  [facility]   PHYSICIAN:  Melissa Weiss, M.D.DATE OF BIRTH:  May 29, 1981   DATE OF ADMISSION:  05/13/2007  DATE OF DISCHARGE:  05/17/2007                               DISCHARGE SUMMARY   ADMISSION DIAGNOSES:  1. Intrauterine pregnancy at 13 weeks and 0 days gestation.  2. Pyelonephritis.  3. Methicillin resistant Staphylococcus aureus infection of the left      facial cheek.   DISCHARGE DIAGNOSES:  1. Intrauterine pregnancy at 13-4/7 weeks.  2. Pyelonephritis resolving.  3. Methicillin resistant Staphylococcus aureus cellulitis of left      cheek, improved.   PROCEDURE:  Intravenous antibiotics.   HOSPITAL COURSE:  Ms. Deakins is a 30 year old gravida 1, para 0 who  presents at 13-0/[redacted] weeks gestation with complaint of CVA tenderness,  fever and increasing redness and swelling of her left cheek and jaw  secondary to MRSA.  Patient with a 24-hour history of back pain and  fever as well as nausea and dizziness accompanied by flank pain.  The  patient's pregnancy is remarkable for:  1. MRSA.  2. History of HSV-2.  3. History of Crohn's disease.  4. History of pyelonephritis x3 last year.   The patient was admitted to the third floor at which time she was  started on intravenous antibiotics.  Patient was followed by the M.D.  service. Patient remained afebrile throughout her hospitalization.  Infectious disease consult was obtained due to the current MRSA.  The  patient was originally started on gentamicin and clindamycin on  admission as well as ampicillin.  After infectious disease practitioner  saw patient she was changed to vancomycin and gentamicin and clindamycin  were discontinued as was ampicillin.  The patient however had a  vasomotor reaction to vancomycin and therefore was changed to Cubicin  per  infectious disease.  Throughout the hospitalization patient reported  resolution of her back pain.  However, she has continued to have left  mandible pain.  The patient underwent ultrasound which revealed a viable  intrauterine pregnancy. The patient also had stool cultures for C.  difficile which were negative, with urine culture with no growth as well  to date.  Wound culture obtained also was no growth as well.  Patient with no bleeding or cramping throughout her hospital stay.  The  evening before her discharge a PICC line or mid line percutaneous  central IV was attempted to be placed.  However, patient did not  tolerate and therefore she was discharged to home with a peripheral IV.   DISCHARGE INSTRUCTIONS:  Patient to maintain peripheral IV and that will  be managed by [facility].  The patient will continue with  intravenous antibiotics on a daily basis for the next 7 days.  Routine  activity.  Patient to monitor for fever or recurrence of CVA tenderness  or urinary tract infection symptoms.  Patient was also given  instructions on maintaining her peripheral IV  until Home Health assumes  management of her peripheral IV on the day of discharge.   On the day of discharge patient was feeling better and tolerating a diet  without difficulty, and desired to be discharged home.  The patient's  vital signs remained stable and face was less swollen.  CVA tenderness  has resolved.   DISCHARGE MEDICATIONS:  1. Cu bison IV daily for the next 7 days.  2. Bactrim DS one tablet b.i.d. x14 days after intravenous antibiotics      are complete for which she was given a prescription.  3. Bactroban ointment intranasally t.i.d. for the next week.   DISCHARGE FOLLOW UP:  In one month with infectious disease, Dr. Ninetta Lights.  In two weeks with Dr. Estanislado Pandy.      Melissa Weiss, CNM      Melissa Weiss, M.D.  Electronically Signed    NOS/MEDQ  D:  05/17/2007  T:  05/17/2007  Job:   [id number]

## 2010-12-19 NOTE — H&P (Signed)
Melissa Weiss, BACKER NO.:  [id number]   MEDICAL RECORD NO.:  [id number]          PATIENT TYPE:  INP   LOCATION:  9163                          FACILITY:  [facility]   PHYSICIAN:  Janine Limbo, M.D.DATE OF BIRTH:  1980-12-11   DATE OF ADMISSION:  11/06/2007  DATE OF DISCHARGE:                              HISTORY & PHYSICAL   The patient is a 30 year old married white female primigravida at 38-3/7  weeks for an [facility] of November 17, 2007, who presents in the office in early  labor and spontaneous rupture of membranes while in office for labor  check this afternoon.  She reports regular contractions since this  morning.  She is complaining of 8/10 pain in back and stomach with  contractions.  She has had nausea and vomiting she reports 6 times  today.  She denies any PIH or UTI signs or symptoms,  fever, shortness  of breath, cough or other complaints.  Followed by MD service with Dr.  Estanislado Pandy as her primary.   History is remarkable for:  1. MRSA on her face with treatment during her pregnancy, in her left      cheek I believe.  2. First trimester vaginal bleeding.  3. History of HSV II with no lesions at the office today on exam.  4. History of frequent UTIs and pyelonephritis.  5. History of Crohn's disease.   OBSTETRICAL HISTORY:  The patient is a primigravida.   PRENATAL LABORATORIES:  Hemoglobin at new OB workup was 13.8, platelets  231.  Blood type is A+, Rh antibody screen negative, hepatitis surface  antigen negative, rubella immune.  Cystic fibrosis screen was negative.  HIV was negative.  Group beta strep is negative.  GC and chlamydia  cultures negative.   ALLERGIES:  CODEINE, ERYTHROMYCIN, VICODIN AND DEMEROL ALL CAUSE  VOMITING.  BENADRYL AND LIDOCAINE.  SHE DENIES LATEX ALLERGY.   PAST MEDICAL HISTORY:  Reports menarche at 30 years of age with average  of 33-day cycles, 5 days of flow.  She reports Tri-Sprintec  birth  control pills for  contraception in the past.  She does report a history  of gonorrhea in 2007 and was treated.  She reports history of HSV 2,  occasional yeast, MRSA diagnosedApril 2008 in her cheek.  It was  treated.  Reports normal childhood illnesses and vaccines, including  hepatitis B.  Frequent UTIs, she reports usually 3 to 4 per year.  She  did have pyelonephritis 3 times previous year before pregnancy, for  which she received shots and treatment.  Crohn's disease in college.  No  flares and not on any medications.   SURGERIES:  She reports wisdom teeth extraction, tonsils and adenoids,  an endoscopy with her history of Crohn's.   SOCIAL HISTORY:  Married white female.  They do not report a religious  affiliation.  Husband's name is [name].  The patient has a  bachelor's education.  Question on chart whether the patient does  something with hearing aids.  Husband has had 13 years of education and  is also employed full-time.  She denied any alcohol, tobacco or illicit  drug use.   FAMILY HISTORY:  Maternal grandfather CHF and had bypass.  Dad with some  kind of heart disease as well.  Maternal grandfather and her dad MIs.  Mom and maternal aunt varicosities.  Sister with asthma.  Paternal  grandmother stroke and Alzheimer's.  The patient's mom, maternal  grandmother, and maternal aunt anemia.  Maternal grandfather with  one  functioning kidney.  Maternal grandfather prostate cancer.  Paternal  grandfather severe depression.  Genetic history:  First cousin born with  heart outside of chest, otherwise unremarkable.   HISTORY OF PRESENT PREGNANCY:  The patient entered care August 21 for  new OB interview,  approximately 6-4/7 weeks.  She did have ultrasound  for viability and size consistent with dates.  On September 12 she was 9  and 5.  The patient with MRSA infection.  Plan was made by Dr. Estanislado Pandy to  use clindamycin.  She also had her new OB workup that day as well,  planned Dr. Estanislado Pandy as  her primary.  Cultures were done, prenatal labs  drawn, planned for first trimester screen.  The patient then seen at  approximately 13 weeks as a work-in for severe lower back pain and  dysuria, nausea and vomiting, some diarrhea, low-grade temperature,  unable to keep any food or fluids down.  She reported a T-max of 101.5,  reported bright red blood when wiped, urethral pressure, positive CVA  tenderness on the left.  The cervix was closed.  Urine was sent for  culture.  No vaginal bleeding on bimanual exam.  She was currently  continuing her clindamycin for her active MRSA infection on the left  cheek on the jawline, which was swollen and puffy.  She did report that  the patient's husband's nasal swabs were negative on September 12.  The  patient was sent to MAU for continued evaluation and was admitted for  pyelonephritis.  Part of the prenatal record is missing at the time of  this dictation.  Prenatal records ends at 13-plus-week visit and resumes  at 47 and 2.  We will call the office regarding this.  At approximately  37 and 38 weeks, the patient with increased mucus discharge and cervix  1+, 50% vertex and -1.  Membranes were stripped at 38 and 2 by Dr.  Estanislado Pandy, and then the patient returned at 46 and 3 today to office with  regular contractions and spontaneous rupture of membranes.   OBJECTIVE:  VITAL SIGNS ON ADMISSION:  Blood pressure 123/96,  respirations 18, temperature 99.4, heart rate was 123.  Blood pressure  at the office with 132/80.  EFM:  Fetal heart rate baseline 130,  moderate variability, reactive, no decels.  Toco:  Uterine contractions  3 to 5 minutes with ripples and moderate on palpation.  GENERAL:  Noted discomfort.  Alert, oriented x 3.  Noted nervousness.  HEENT:  Within normal limits and grossly intact.  CARDIOVASCULAR:  Regular rate and rhythm.  Slightly tachy.  No murmur.  LUNGS:  Clear to auscultation bilaterally.  ABDOMEN:  Soft and nontender, gravid.   Estimated fetal weight 6-1/2 to 7  pounds.  PELVIC:  Deferred at the office.  Her cervix was 2 to 3 cm, 60% to 70%,  -2 station and vertex and without lesions.  EXTREMITIES:  Mild generalized edema bilaterally.  No clonus.  DTRs were  2+.   IMPRESSION:  1. Intrauterine pregnancy at 61 and 3.  2.  Spontaneous rupture of membranes with clear fluid and in early      labor.  3. Group beta strep negative.  4. History of herpes simplex virus without lesions currently.  5. History of methicillin-resistant Staphylococcus aureus on face.  6. Slightly tachycardic.   PLAN:  1. Admit to birthing suites with Dr. Stefano Gaul as attending physician.  2. Routine L&D orders with epidural p.r.n.  3. MD to follow.      Candice Denny Levy, [place]      Janine Limbo, M.D.  Electronically Signed    CHS/MEDQ  D:  11/06/2007  T:  11/06/2007  Job:  [id number]

## 2010-12-19 NOTE — H&P (Signed)
Melissa Weiss, Melissa Weiss                ACCOUNT NO.:  [id number]   MEDICAL RECORD NO.:  [id number]          PATIENT TYPE:  OBV   LOCATION:  9320                          FACILITY:  [facility]   PHYSICIAN:  Osborn Coho, M.D.   DATE OF BIRTH:  [birth year]   DATE OF ADMISSION:  05/13/2007  DATE OF DISCHARGE:                              HISTORY & PHYSICAL   HISTORY OF PRESENT ILLNESS:  Melissa Weiss is a 30 year old gravida 1, para  0 at [redacted] weeks gestation who presented from the office with a complaint  of fever to the max of 100.5 in the last 24 hours, left CVA tenderness,  and an ongoing methicillin resistant Staphylococcus aureus infection of  left facial cheek.  The patient reports the symptoms of the back pain  and fever began last night.  She has had a history of pyelonephritis in  the past and advised that this feels very similar.  Her MRSA issue was  diagnosed in April, she was treated with doxycycline, and then has been  on 2 weeks of clindamycin since her issue has not improved.  She reports  that for the facial MRSA she does have pain in that left cheek area and  difficulty with chewing.   PREGNANCY HAS BEEN REMARKABLE FOR:  1. Methicillin resistant Staphylococcus aureus infection since April      on the left cheek.  2. 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  3. History of Crohn's disease, no current medication or flare up.  4. History of pyelonephritis times three last year.   PRENATAL LABS:  Blood type is A positive Rh antibody negative, VDRL  nonreactive, rubella titer positive, hepatitis B surface antigen  negative, cystic fibrosis testing was negative.  Hemoglobin was 13.8 at  her OB visit in September, platelet count was 231,000.  HIV was  nonreactive.  GC and chlamydia cultures were negative at that time.  She  had a wound culture on April 16, 2007 that did show MRSA in the left  cheek area, it was sensitive to gentamicin, trimethoprim  sulfa,  vancomycin, linezolid, quinupristin, rifampin, and tetracycline.   HISTORY OF CURRENT PREGNANCY:  The patient entered care at approximately  7 weeks.  She had been diagnosed with MRSA in April.  She had an episode  of Pseudomonas folliculitis following hot tub use, was then admitted to  the hospital for three days in [facility] and then was found to  have MRSA subsequent to that.  She was treated with doxycycline at that  time however, the left facial lesion has not resolved.  She has had two  negative nasal cultures and her husband was also negative.  She was to  have a visit tomorrow with Dr. Estanislado Pandy to consider whether she was going  to need to be treated with gentamicin.  She was seen in the office today  for a complaint of fever and back pain.  She does have a history of  pyelonephritis three times last year.   OBSTETRICAL HISTORY:  The patient is  a primigravida.   PAST MEDICAL HISTORY:  She is a previous birth control user with Tri-  Sprintec.  Her last Pap was done in July 2008 and was normal.  She was  treated for [facility] in 2007.  She has a history of HSV 2 in the past but has  had no recent or current lesions.  She has had occasional yeast  infections.  She reports the usual childhood illnesses.  She has a  history of frequent UTIs, and she had pyelonephritis three times last  year and was treated with parenteral antibiotics, hospitalized one time.  She does have a history of anemia.  She has a history of Crohn's disease  that was diagnosed in college.  She has had no flares since that time.   PAST SURGICAL HISTORY:  Wisdom teeth removed.  Tonsils and adenoids done  at age 69.  She had a sigmoidoscopy and endoscopy and laparoscopic  procedure for Crohn's disease after she had rectal bleeding.  She was  treated for Pseudomonas folliculitis times three days in April and then  has also been hospitalized for cyst disease in the past.   ALLERGIES:  She is GI sensitive to  CODEINE, ERYTHROMYCIN, DEMEROL, and  VICODIN which cause vomiting.  LIDOCAINE and BENADRYL cause swelling.   FAMILY HISTORY:  Her maternal grandfather had congestive heart failure  and quadruple bypass surgery.  Her father used nitroglycerin.  Her  mother has angina.  Her maternal grandfather and father, maternal uncle  have hypertension.  Her mother and maternal aunt have varicosities.  Her  sister has asthma.  Her paternal grandmother had strokes and  Alzheimer's.  Her paternal grandfather had Alzheimer's.  Her mother,  maternal grandmother and maternal aunt have anemia.  Her maternal  grandfather had a nonfunctioning kidney.  Her maternal grandfather also  had prostate cancer.  Her paternal grandfather had severe depression.  Her paternal first cousin was remarkable for being born with a heart  outside of the chest.   SOCIAL HISTORY:  The patient is married to the father of the baby, he is  involved and supportive, his name is Melissa Weiss.  The patient is  Caucasian of nondenominational faith, she is an [place] speaker.  She  has a bachelor's degree and is employed with [employer].  Her  husband has also about 1 year of college.  He is employed in [employer].  She has been followed by the Physician Service at  [facility].  She denies any alcohol, drug, or tobacco use  during this pregnancy.   PHYSICAL EXAMINATION:  VITAL SIGNS:  Temperature max here in the  hospital is 99.7, pulse is 100, respirations are 20, blood pressure is  102/72.  GENERAL:  The patient does appear ill.  HEART:  Regular rate and rhythm without murmur.  BREASTS:  Soft and non-tender.  ABDOMEN:  There is positive left CVA tenderness.  The abdomen is gravid  with 13 weeks fundal height.  There is mild left lower quadrant  tenderness.  EXTREMITIES:  Normal.  NEUROLOGICAL EXAM:  Normal.  SKIN:  There is a MRSA lesion on her left lower jaw, that area is  swollen.  SACRAL EXAM:   Deferred.  DIGITAL CERVICAL EXAM:  Closed and long in the office.  Baseline fetal  heart rate was 155 by Doppler.   LABORATORY DATA:  UA showed small leukocyte esterase, 30 mg of protein,  large hemoglobin, few epithelial's, 11-20 WBC, red  blood cells too  numerous to count and bacteria.  CBC shows a hemoglobin of 14.5,  hematocrit of 40.6, platelet count of 197,000, white blood cell count of  9.4, neutrophils show a shift to the left of 81.   IMPRESSION:  1. Intrauterine pregnancy at 13 weeks.  2. Pyelonephritis.  3. Methicillin resistant Staphylococcus aureus infection to the left      facial cheek.   PLAN:  1. Admit to [facility] for consult with Dr. Osborn Coho who is the attending physician for 23-hour observation.  2. Ampicillin, gentamicin, and clindamycin will be given IV.  3. Will give Pyridium for comfort.  The patient currently declines      pain medication.  4. M.D.'s will follow.  5. Will await urine culture.      Renaldo Reel Emilee Hero, C.N.M.      Osborn Coho, M.D.  Electronically Signed    VLL/MEDQ  D:  05/13/2007  T:  05/13/2007  Job:  [id number]

## 2010-12-22 NOTE — Procedures (Signed)
[place]. [facility]  Patient:    GLORA, HULGAN Visit Number: [id number] MRN: [id number]          Service Type: END Location: ENDO Attending Physician:  Rich Brave Dictated by:   Florencia Reasons, M.D. Proc. Date: 10/03/01 Admit Date:  10/03/2001   CC:         Roxy Manns, M.D. [facility]   Procedure Report  PROCEDURE:  Colonoscopy with biopsies.  INDICATION:  A 30 year old female with a recurring, periodic, episodic illness characterized by severe low right lower quadrant and low abdominal pain, diarrhea, and nausea and vomiting, also associated with rectal bleeding.  FINDINGS:  Anal fissure.  Solitary tiny ulceration in the terminal ileum.  PROCEDURE:  The nature, purpose, and risks of the procedure were familiar to the patient from prior examinations and she provided written consent. Sedation was fentanyl 150 mcg and Versed 16 mg IV prior to and during this procedure and the upper endoscopy, which preceded it.  There was no problem with arrhythmias or desaturation.  The Olympus pediatric adjustable tension video colonoscope was advanced without much difficulty to the terminal ileum, which was entered for probably 20 or 25 cm before pullback was initiated.  The procedure was done unprepped, intentionally, so as to see if there was any residual blood present.  Note that the patients last bleeding observed was yesterday.  Despite the absence of the prep, there was very little stool present.  There was a small to moderate amount of stool, somewhat pasty or semiformed in character in the sigmoid region.  Overall, however, it was felt that most areas were able to be well-seen despite the absence of a prep.  This was presumably due to the patients antecedent diarrhea, which continued through today, per the patients report.  The main findings on this examination were an anal fissure and a solitary ileal ulceration.  The anal fissure was  located at about 6 oclock (that is, ventral location and there was no significant associated tenderness or any anal stenosis, induration, fluctuance, etc. on digital examination).  The ileal ulceration was about 2 mm in size, ______ in appearance, and not associated with any other ileal abnormalities, such as diffuse erythema, other ulcerations, nodularities, stenoses, etc.  The cecum and ileocecal valve were normal in appearance.  In addition to biopsing the ileal ulceration, I obtained additional random biopsies from the ileum and along the length of the colon prior to removal of the scope.  Retroflexion in the rectum was unremarkable, as was pullout through the anal canal.  The anal fissure was best visualized on perianal digital inspection.  The patient tolerated this procedure quite well and there were no apparent complications.  IMPRESSION: 1. Ventral anal fissure. 2. Focal ulceration in the terminal ileum of uncertain clinical significance    (denies recent aspirin or NSAID exposure). 3. Small amount of semiformed stool present, but no blood in the colonic    lumen.  DISCUSSION:  The patients rectal bleeding is almost certainly about rectal outlet origin based on todays findings.  The reason for her diarrhea and abdominal pain remains unclear.  PLAN:  Await pathology on todays biopsies.  Will await follow-up in the office. Dictated by:   Florencia Reasons, M.D. Attending Physician:  Rich Brave DD:  10/03/01 TD:  10/03/01 Job: [id number]

## 2010-12-22 NOTE — Discharge Summary (Signed)
NAME:  Melissa Weiss, Melissa Weiss                          ACCOUNT NO.:  [id number]   MEDICAL RECORD NO.:  [id number]                   PATIENT TYPE:  INP   LOCATION:  5030                                 FACILITY:  [facility]   PHYSICIAN:  Valetta Mole. Swords, M.D. [facility]           DATE OF BIRTH:  06/07/1981   DATE OF ADMISSION:  08/06/2002  DATE OF DISCHARGE:                                 DISCHARGE SUMMARY   DISCHARGE DIAGNOSES:  1. Painful urticaria, resolved.  2. Panic attacks.  3. Mildly positive ANA (titer 1:80).  4. Rectal bleeding, followed by Dr. Matthias Hughs.   DISCHARGE MEDICATIONS:  1. Prednisone 10 mg p.o. q.d. x4 days.  2. Xanax 0.25 mg p.o. b.i.d. p.r.n., #10 are given.  3. Claritin 10 mg p.o. q.d., over-the-counter.   CONDITION ON DISCHARGE:  Improved.   FOLLOW UP:  She is to follow up with Dr. Jeral Fruit in 1 to 2 weeks.   LABORATORY DATA:  Discharge laboratories:  BMP normal except glucose of 143  on August 07, 2002. CK-MB normal. CBC on admission white count 9.7,  hemoglobin 11.6, platelet count 289,000. August 07, 2002, white count 25.4  (on steroids), hemoglobin 11.2 and platelet count 330,000. Blood cultures  and urine cultures remained negative. An ANA was positive at 1:80 in a  homogenous pattern. Rheumatoid factor negative.   A CT scan of the head on August 06, 2002, was negative. Unclear whether this  was done.   HOSPITAL COURSE:  The patient was admitted to the hospital service on  August 06, 2002. See my dictated note for details. The patient was admitted  with what seemed to be painful urticaria. She was aching all over and  developed an erythematous rash. This was treated with steroids and  nonsedating antihistamines, Claritin. She improved rapidly.   On the second hospital evening, she had a presumed panic attack, possibly  caused by prednisone. This is well outlined in Dr. Diamantina Monks on-call note.  She complained of chest pain. She was quite tachycardic. The patient  was  medicated with benzodiazepines, Haldol and morphine and suddenly felt well.   At the time of discharge she was feeling well. She was eager to go home. She  was eating and ambulating in the halls without difficulty.   Psychiatric, likely panic attack. Will decrease the steroids dramatically on  discharge and treat her with low dose benzodiazepines for a short period of  time. The patient will follow up with Dr. Milinda Antis.                                               Bruce Rexene Edison Swords, M.D. [facility]    BHS/MEDQ  D:  08/08/2002  T:  08/08/2002  Job:  [id number]   cc:   Bernette Redbird, M.D.  [address]  [place], [place] [address]  Fax: [phone]   Idamae Schuller A. Tower, M.D. [facility]  [address] [place]  [place] [address]  Fax: [phone]

## 2010-12-22 NOTE — Procedures (Signed)
[place]. [facility]  Patient:    Melissa Weiss, Melissa Weiss Visit Number: [id number] MRN: [id number]          Service Type: END Location: ENDO Attending Physician:  Rich Brave Dictated by:   Florencia Reasons, M.D. Proc. Date: 10/03/01 Admit Date:  10/03/2001   CC:         Roxy Manns, M.D. [facility]   Procedure Report  PROCEDURE:  Upper endoscopy with biopsies.  INDICATION:  A 30 year old female, who has a recurring illness characterized by diarrhea and low abdominal pain, also nausea and vomiting.  FINDINGS:  Normal examination.  PROCEDURE:  The nature, purpose, and risks of the procedure were familiar to the patient, provided written consent.  Sedation was fentanyl 125 mcg and Versed 14 mg IV without arrhythmias or desaturation.  The Olympus small caliber adult video endoscope was passed under direct vision without difficulty.  The vocal cords were briefly seen and appeared normal.  There might have been some slight thickening of the posterior commissure of the larynx.  The esophageal mucosa was normal.  No reflux was seen.  There was no reflux esophagitis, Barretts esophagus, varices, infection or neoplasia nor any ring, stricture or hiatal hernia appreciated.  The stomach contained no residual or retained food and had normal mucosa without evidence of gastritis, erosions, ulcers, polyps or masses, including a retroflexed view of the proximal stomach.  The pylorus, duodenal bulb and second duodenum were grossly unremarkable.  There was perhaps a tiny bit of hypervascularity of the duodenal mucosa and some perhaps minimal scalloping of the duodenal rings, but there appeared to be, endoscopically, a normal villous mucosal appearance.  I did obtain multiple duodenal biopsies, however, to help rule out celiac disease, GERD, etc.  The duodenal bulb was carefully inspected with four quadrant pull-through and the scope was then removed from the  patient.  She tolerated the procedure well and there were no apparent complications.  IMPRESSION:  Essentially normal upper endoscopy.  PLAN: 1. Await pathology on duodenal biopsies. 2. Proceed to colonoscopic evaluation. Dictated by:   Florencia Reasons, M.D. Attending Physician:  Rich Brave DD:  10/03/01 TD:  10/03/01 Job: [id number]

## 2010-12-22 NOTE — H&P (Signed)
NAME:  Melissa Weiss, Melissa Weiss                          ACCOUNT NO.:  [id number]   MEDICAL RECORD NO.:  [id number]                   PATIENT TYPE:  EMS   LOCATION:  MAJO                                 FACILITY:  [facility]   PHYSICIAN:  Valetta Mole. Weiss, M.D. [facility]           DATE OF BIRTH:  Mar 16, 1981   DATE OF ADMISSION:  08/06/2002  DATE OF DISCHARGE:                                HISTORY & PHYSICAL   HISTORY OF PRESENT ILLNESS:  The patient is a 30 year old female who is  generally healthy.  At 1:30 p.m. yesterday afternoon, she developed a red,  erythematous area on her right knee.  This resolved spontaneously, but  became quite painful.  Over the past 24 hours, she has developed multiple  areas similar to this diffusely.  These areas are accompanied by symptoms of  erythema, burning, and itching.  The areas seem to come and go.  She also  admits to aching all over.  She denies any fevers, chills, or sweats.  She  admits to some nausea.  She also admits to one-and-a-half weeks ago having a  bout of diarrhea which resolved spontaneously.  She also feels like her  throat is swollen and it takes more effort to swallow.  She denies any  shortness of breath or wheezing.  She denies any new possible allergens,  specifically any new foods, clothing, detergents, or lotions.  She denies  any other ill contacts.   PAST MEDICAL HISTORY:  Significant for recurrent rectal bleeding.  She has  been told by Melissa Weiss, M.D., that she does not have colitis or Crohn's  disease, but that has been discussed in the past.  She has undergone EGD and  colonoscopy in the past.  She has also had tonsillectomy and adenoidectomy.   SOCIAL HISTORY:  She lives with her fiancee.  She has two dogs which she has  had for a long time, both of which have been healthy.  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  She denies any  illicit drug use.  She does not smoke or drink significant  amounts of  alcohol.   MEDICATIONS:  None, except for Benadryl which she took for itching last  night.   FAMILY HISTORY:  Noncontributory.   REVIEW OF SYSTEMS:  She describes diffuse arthralgias, although no true  joint swelling.  She complains that her left hand hurts worse than her  right.  She denies any GI symptoms.  She denies any recent rectal bleeding.  She denies any abdominal pain, shortness of breath, chest pain, or any other  complaints other than those listed above.   PHYSICAL EXAMINATION:  VITAL SIGNS:  Temperature 97.6 degrees, heart rate  101, blood pressure 103/60, respirations 12, pulse oximetry 98% on room air.  GENERAL APPEARANCE:  She appears to be a well-developed, well-nourished  female in no acute distress.  HEENT:  Atraumatic.  Some soft tissue swelling around the eyes.  Otherwise  normocephalic.  The oropharynx is moist without any erythema or exudates.  Tonsils are absent.  NECK:  Supple without lymphadenopathy, thyromegaly, jugular venous  distention, or carotid bruits.  CHEST:  Clear to auscultation without any increased work of breathing.  CARDIAC:  S1 and S2 are normal without significant gallop.  She has a 1-2/6  holosystolic murmur at the apex.  ABDOMEN:  Active bowel sounds.  Soft and nontender.  There is no  hepatosplenomegaly.  No masses are palpated.  EXTREMITIES:  There is no cyanosis, clubbing, or edema.  NEUROLOGIC:  She is alert and oriented.  DERMATOLOGIC:  She has multiple well-demarcated areas of raised erythema  measuring approximately 1-2 cm.  Most of them are circular.  There are areas  where she has scratched which are markedly erythematous and are in the shape  of excoriations.  These areas are palpable and mildly tender to palpation.  MUSCULOSKELETAL:  I think there is some tenosynovitis of the left wrist, but  no other joint effusions or tenosynovitis is noted.   ASSESSMENT AND PLAN:  A very difficult case with an unusual history.   I  think the best way to describe this is painful urticaria.  Hives are  typically not painful.  I discussed with Melissa [place]. Yetta Weiss, M.D.  The plan for  now would be to evaluate for connective tissue disease.  Will perform  sedimentation rate, ANA, and rheumatoid arthritis screen.  Her laboratories  so far are normal, including CBC and CMET.  Urine pregnancy is negative.  Urinalysis is negative.  We will perform blood cultures and urine culture.  Will empirically start a nonsedating antihistamine and also steroids.  I  think this will resolve spontaneously.  We will cover her with pain  medications because she is quite uncomfortable at the moment.                                               Melissa Weiss, M.D. [facility]    BHS/MEDQ  D:  08/06/2002  T:  08/06/2002  Job:  [id number]   cc:   Melissa Weiss, M.D. [facility]  [address], [place]  [place] [address]  Fax: [phone]   Melissa Weiss, M.D.  [address] [place]  [place], [place] [address]  Fax: [phone]

## 2011-04-12 ENCOUNTER — Observation Stay: Payer: Self-pay | Admitting: Internal Medicine

## 2011-04-13 DIAGNOSIS — I200 Unstable angina: Secondary | ICD-10-CM

## 2011-04-26 LAB — URINE MICROSCOPIC-ADD ON

## 2011-04-26 LAB — URINALYSIS, ROUTINE W REFLEX MICROSCOPIC
Bilirubin Urine: NEGATIVE
Glucose, UA: NEGATIVE
Urobilinogen, UA: 0.2

## 2011-04-30 LAB — COMPREHENSIVE METABOLIC PANEL
Alkaline Phosphatase: 133 — ABNORMAL HIGH
BUN: 3 — ABNORMAL LOW
Calcium: 8.4
Glucose, Bld: 73
Total Protein: 5.7 — ABNORMAL LOW

## 2011-04-30 LAB — DIFFERENTIAL
Basophils Relative: 0
Lymphs Abs: 1.1
Monocytes Relative: 7
Neutro Abs: 7.5
Neutrophils Relative %: 80 — ABNORMAL HIGH

## 2011-04-30 LAB — URINE MICROSCOPIC-ADD ON

## 2011-04-30 LAB — URINALYSIS, ROUTINE W REFLEX MICROSCOPIC
Glucose, UA: NEGATIVE
Glucose, UA: NEGATIVE
Nitrite: NEGATIVE
Protein, ur: NEGATIVE
Urobilinogen, UA: 0.2

## 2011-04-30 LAB — URINE CULTURE: Colony Count: 70000

## 2011-04-30 LAB — CBC
RDW: 13.5
WBC: 9.4

## 2011-04-30 LAB — LIPASE, BLOOD: Lipase: 30

## 2011-04-30 LAB — AMYLASE: Amylase: 90

## 2011-05-01 LAB — CBC
HCT: 39.2
Hemoglobin: 10.7 — ABNORMAL LOW
MCHC: 35.3
Platelets: 198
RBC: 3.16 — ABNORMAL LOW
WBC: 15.8 — ABNORMAL HIGH
WBC: 9.6

## 2011-05-01 LAB — URINALYSIS, ROUTINE W REFLEX MICROSCOPIC
Glucose, UA: NEGATIVE
Leukocytes, UA: NEGATIVE
Specific Gravity, Urine: >1.03 — ABNORMAL HIGH
pH: 6

## 2011-05-01 LAB — URINE MICROSCOPIC-ADD ON

## 2011-05-01 LAB — URINE CULTURE

## 2011-05-01 LAB — RPR: RPR Ser Ql: NONREACTIVE

## 2011-05-17 LAB — URINALYSIS, ROUTINE W REFLEX MICROSCOPIC
Bilirubin Urine: NEGATIVE
Bilirubin Urine: NEGATIVE
Glucose, UA: NEGATIVE
Hgb urine dipstick: NEGATIVE
Ketones, ur: NEGATIVE
Nitrite: NEGATIVE
Nitrite: NEGATIVE
Protein, ur: NEGATIVE
Specific Gravity, Urine: 1.02
Urobilinogen, UA: 0.2
pH: 7

## 2011-05-17 LAB — CLOSTRIDIUM DIFFICILE EIA

## 2011-05-17 LAB — DIFFERENTIAL
Basophils Absolute: 0
Basophils Relative: 0
Basophils Relative: 0
Eosinophils Absolute: 0
Eosinophils Absolute: 0.1
Eosinophils Relative: 0
Lymphs Abs: 1.2
Monocytes Absolute: 0.5
Monocytes Relative: 5
Monocytes Relative: 8
Neutro Abs: 4.7
Neutrophils Relative %: 69

## 2011-05-17 LAB — CBC
HCT: 40.6
HCT: 42
Hemoglobin: 14.9
MCHC: 35.5
MCHC: 35.8
MCHC: 36.5 — ABNORMAL HIGH
MCV: 92.8
MCV: 92.9
Platelets: 162
Platelets: 216
RBC: 4.37
RDW: 12.7
RDW: 13.3
WBC: 9.4

## 2011-05-17 LAB — COMPREHENSIVE METABOLIC PANEL
Albumin: 3.4 — ABNORMAL LOW
Alkaline Phosphatase: 54
BUN: 6
Calcium: 9
Glucose, Bld: 83
Potassium: 3.4 — ABNORMAL LOW
Sodium: 137
Total Protein: 6.8

## 2011-05-17 LAB — URINE MICROSCOPIC-ADD ON

## 2011-05-17 LAB — RAPID URINE DRUG SCREEN, HOSP PERFORMED
Amphetamines: NOT DETECTED
Tetrahydrocannabinol: NOT DETECTED

## 2011-05-17 LAB — WOUND CULTURE
Culture: NO GROWTH
Gram Stain: NONE SEEN

## 2011-05-17 LAB — URINE CULTURE: Special Requests: NEGATIVE

## 2012-01-28 ENCOUNTER — Encounter: Payer: Self-pay | Admitting: Family Medicine

## 2012-01-28 ENCOUNTER — Ambulatory Visit (INDEPENDENT_AMBULATORY_CARE_PROVIDER_SITE_OTHER): Payer: No Typology Code available for payment source | Admitting: Family Medicine

## 2012-01-28 VITALS — BP 135/88 | HR 123 | Temp 98.5°F | Wt 136.8 lb

## 2012-01-28 DIAGNOSIS — L0201 Cutaneous abscess of face: Secondary | ICD-10-CM

## 2012-01-28 DIAGNOSIS — L03211 Cellulitis of face: Secondary | ICD-10-CM | POA: Insufficient documentation

## 2012-01-28 MED ORDER — CEPHALEXIN 500 MG PO CAPS: 500.0000 mg | ORAL_CAPSULE | Freq: Three times a day (TID) | ORAL | Status: AC

## 2012-01-28 MED ORDER — DOXYCYCLINE HYCLATE 100 MG PO CAPS: 100.0000 mg | ORAL_CAPSULE | Freq: Two times a day (BID) | ORAL | Status: AC

## 2012-01-28 MED ORDER — MUPIROCIN CALCIUM 2 % EX CREA: TOPICAL_CREAM | Freq: Three times a day (TID) | CUTANEOUS | Status: AC

## 2012-01-28 NOTE — Progress Notes (Addendum)
  Subjective:    Patient ID: Melissa Weiss, female    DOB: February 23, 1981, 31 y.o.   MRN: [id number]  HPI CC: R facial swelling  Increased stress recently - mother with appendiceal cancer with mets.  Has been in and out of hospital so pt has also been to hospital multiple times.  Pt is audiologist by training, lots of traveling.  Now with 3 wk h/o facial breaking out, treated with clearasil and H2O2.  Yesterday when squeezing lesion, drained large amt yellow fluid.  Infection localized to right lower lip.  So far has taken ibuprofen for this.  Tachycardic.  Tmax to 99.  Feeling nauseated, as well as with diarrhea on and off.  Pulse Readings from Last 3 Encounters:  01/28/12 123  09/05/10 104  09/04/10 78    H/o MRSA infection left lower lip 4 yrs ago necessitating hospitalization with IV abx - during last pregnancy.  No h/o fever blisters. Has had chicken pox in past.  No abx in last 2 months.  However has recently been treated with cipro for UTI then zithromax for sinusitis as well as bactrim for UTI in last 6 months.   Past Medical History  Diagnosis Date  . Anemia   . Colitis   . Other acne   . Anxiety state, unspecified   . Lipoma of unspecified site   . Neoplasm of unspecified nature of bone, soft tissue, and skin   . Personal history of contact with and (suspected) exposure to potentially hazardous body fluids   . Ovarian cyst 2000  . Adnexal mass 2000  . Colon polyp     benign  . H/O urticaria   . Folliculitis 3/08    pseudomonas     Review of Systems Per HPI    Objective:   Physical Exam  Nursing note and vitals reviewed. Constitutional: She appears well-developed and well-nourished. No distress.  HENT:  Head: Atraumatic.    Mouth/Throat: Oropharynx is clear and moist. No oropharyngeal exudate.       Right side: inferior to lower lip with crusted lesion about 1cm diameter, also associated R lip swelling.  mild surrounding erythema.  Induration around  lesion, no fluctuance  Neck: Normal range of motion. Neck supple.  Cardiovascular: Regular rhythm, normal heart sounds and intact distal pulses.  Tachycardia present.   No murmur heard. Pulmonary/Chest: Effort normal and breath sounds normal. No respiratory distress. She has no wheezes. She has no rales.  Lymphadenopathy:    She has no cervical adenopathy.       Assessment & Plan:

## 2012-01-28 NOTE — Assessment & Plan Note (Addendum)
Going on 3 wks, recently worsening. Anticipate nonpurulent cellulitis. Given location, will treat aggresively with doxy, keflex and mupirocin.   RTC 2 d for recheck.  Has needed IV abx in past. I do feel she is currently appropriate for outpt trial first. Nothing to culture today - dry lesion. If worsening, discussed to ER or urgent care for further evaluation.  ddx includes herpetic lesions, but given duration don't think antivirals would be beneficial.

## 2012-01-28 NOTE — Patient Instructions (Addendum)
You do have lower lip cellulitis, possibly due to MRSA - treat with doxycycline and keflex oral antibiotics.  Use mupirocin cream on that area as well. Return wednesday morning for recheck. if worsening pain/swelling, or spreading redness, or fever >101.5, please seek urgent care.  Cellulitis Cellulitis is an infection of the skin and the tissue beneath it. The area is typically red and tender. It is caused by germs (bacteria) (usually staph or strep) that enter the body through cuts or sores. Cellulitis most commonly occurs in the arms or lower legs.  HOME CARE INSTRUCTIONS   If you are given a prescription for medications which kill germs (antibiotics), take as directed until finished.   If the infection is on the arm or leg, keep the limb elevated as able.   Use a warm cloth several times per day to relieve pain and encourage healing.   See your caregiver for recheck of the infected site as directed if problems arise.   Only take over-the-counter or prescription medicines for pain, discomfort, or fever as directed by your caregiver.  SEEK MEDICAL CARE IF:   The area of redness (inflammation) is spreading, there are red streaks coming from the infected site, or if a part of the infection begins to turn dark in color.   The joint or bone underneath the infected skin becomes painful after the skin has healed.   The infection returns in the same or another area after it seems to have gone away.   A boil or bump swells up. This may be an abscess.   New, unexplained problems such as pain or fever develop.  SEEK IMMEDIATE MEDICAL CARE IF:   You have a fever.   You or your child feels drowsy or lethargic.   There is vomiting, diarrhea, or lasting discomfort or feeling ill (malaise) with muscle aches and pains.  MAKE SURE YOU:   Understand these instructions.   Will watch your condition.   Will get help right away if you are not doing well or get worse.  Document Released:  05/02/2005 Document Revised: 07/12/2011 Document Reviewed: 03/10/2008 [facility] Patient Information 2012 [place], [place].

## 2012-01-30 ENCOUNTER — Ambulatory Visit: Payer: No Typology Code available for payment source | Admitting: Family Medicine

## 2012-02-11 ENCOUNTER — Ambulatory Visit (INDEPENDENT_AMBULATORY_CARE_PROVIDER_SITE_OTHER): Payer: No Typology Code available for payment source | Admitting: Family Medicine

## 2012-02-11 ENCOUNTER — Encounter: Payer: Self-pay | Admitting: Family Medicine

## 2012-02-11 VITALS — BP 100/72 | HR 96 | Temp 97.8°F | Ht 65.0 in | Wt 137.2 lb

## 2012-02-11 DIAGNOSIS — N390 Urinary tract infection, site not specified: Secondary | ICD-10-CM

## 2012-02-11 DIAGNOSIS — R300 Dysuria: Secondary | ICD-10-CM

## 2012-02-11 LAB — POCT URINALYSIS DIPSTICK
Nitrite, UA: NEGATIVE
Urobilinogen, UA: NEGATIVE

## 2012-02-11 MED ORDER — SULFAMETHOXAZOLE-TRIMETHOPRIM 400-80 MG PO TABS: 1.0000 | ORAL_TABLET | Freq: Two times a day (BID) | ORAL | Status: DC

## 2012-02-11 NOTE — Progress Notes (Signed)
   Nature conservation officer at [facility] [address] [place] [address] Phone: [phone] Fax: [phone]   Patient Name: Melissa Weiss Date of Birth: October 18, 1980 Age: 31 y.o. Medical Record Number: [id number] Gender: female Date of Encounter: 02/11/2012  Chief Complaint: Urinary Tract Infection   History of Present Illness:  Melissa Weiss is a 31 y.o. very pleasant female patient who presents with the following:  1 1/2 weeks ago, wa on Doxy and Keflex. Had a lymph node in the L inguinal region - some pulling down --- and going every 5 sec. No pain, but significant urgency.   Carcinoid carcinoma - stage 4  Past Medical History, Surgical History, Social History, Family History, Problem List, Medications, and Allergies have been reviewed and updated if relevant.  Current Outpatient Prescriptions on File Prior to Visit  Medication Sig Dispense Refill  . Norgestimate-Ethinyl Estradiol Triphasic 0.18/0.215/0.25 MG-35 MCG tablet Take 1 tablet by mouth daily.        Review of Systems: ROS: GEN: Acute illness details above GI: Tolerating PO intake GU: maintaining adequate hydration and urination Pulm: No SOB Interactive and getting along well at home.  Otherwise, ROS is as per the HPI.   Physical Examination: Filed Vitals:   02/11/12 1014  BP: 100/72  Pulse: 96  Temp: 97.8 F (36.6 C)   Filed Vitals:   02/11/12 1014  Height: 5\' 5"  (1.651 m)  Weight: 137 lb 4 oz (62.256 kg)   Body mass index is 22.84 kg/(m^2). Ideal Body Weight: Weight in (lb) to have BMI = 25: 149.9    GEN: WDWN, A&Ox4,NAD. Non-toxic HEENT: Atraumatc, normocephalic. CV: RRR, No M/G/R PULM: CTA B, No wheezes, crackles, or rhonchi ABD: S, NT, ND, +BS, no rebound. No CVAT. No suprapubic tenderness. EXT: No c/c/e   EKG / Xrays / Labs: None available at the time of encounter.  Assessment and Plan:  1. UTI (lower urinary tract infection)  Urine culture  2. Dysuria  POCT Urinalysis  Dipstick, Urine culture   UTI - cx and septra  Orders Today: Orders Placed This Encounter  Procedures  . Urine culture  . POCT Urinalysis Dipstick    Medications Today: Meds ordered this encounter  Medications  . DISCONTD: sulfamethoxazole-trimethoprim (BACTRIM,SEPTRA) 400-80 MG per tablet    Sig: Take 1 tablet by mouth 2 (two) times daily.    Dispense:  14 tablet    Refill:  0  . sulfamethoxazole-trimethoprim (BACTRIM,SEPTRA) 400-80 MG per tablet    Sig: Take 1 tablet by mouth 2 (two) times daily.    Dispense:  14 tablet    Refill:  0     [name], MD

## 2012-02-13 ENCOUNTER — Encounter: Payer: Self-pay | Admitting: Family Medicine

## 2012-02-13 ENCOUNTER — Telehealth: Payer: Self-pay | Admitting: Family Medicine

## 2012-02-13 ENCOUNTER — Ambulatory Visit (INDEPENDENT_AMBULATORY_CARE_PROVIDER_SITE_OTHER): Payer: No Typology Code available for payment source | Admitting: Family Medicine

## 2012-02-13 VITALS — BP 90/60 | HR 86 | Temp 98.5°F | Ht 65.0 in | Wt 137.0 lb

## 2012-02-13 DIAGNOSIS — N390 Urinary tract infection, site not specified: Secondary | ICD-10-CM

## 2012-02-13 DIAGNOSIS — L0201 Cutaneous abscess of face: Secondary | ICD-10-CM

## 2012-02-13 LAB — URINE CULTURE
Colony Count: NO GROWTH
Organism ID, Bacteria: NO GROWTH

## 2012-02-13 MED ORDER — CEPHALEXIN 500 MG PO CAPS: 1000.0000 mg | ORAL_CAPSULE | Freq: Two times a day (BID) | ORAL | Status: DC

## 2012-02-13 MED ORDER — CIPROFLOXACIN HCL 250 MG PO TABS: 250.0000 mg | ORAL_TABLET | Freq: Two times a day (BID) | ORAL | Status: DC

## 2012-02-13 MED ORDER — LEVOFLOXACIN 500 MG PO TABS: 500.0000 mg | ORAL_TABLET | Freq: Every day | ORAL | Status: AC

## 2012-02-13 MED ORDER — DOXYCYCLINE HYCLATE 100 MG PO TABS: 100.0000 mg | ORAL_TABLET | Freq: Two times a day (BID) | ORAL | Status: AC

## 2012-02-13 NOTE — Progress Notes (Signed)
Nature conservation officer at [facility] [address] [place] [address] Phone: [phone] Fax: [phone]  Date:  02/13/2012   Name:  Melissa Weiss   DOB:  13-Sep-1980   MRN:  [id number]  PCP:  Roxy Manns, MD    Chief Complaint: Recurrent Skin Infections   History of Present Illness:  Melissa Weiss is a 31 y.o. very pleasant female patient who presents with the following:  Pleasant young female who I remember well who presents with a recurrence of a recent cellulitic infection of her face. She had several boils, and ultimately was treated with Keflex and doxycycline for 14 days, starting on 01/28/2012. She had resolution of symptoms by about day 4. Now she has had formation of a boil on her face that drained earlier in the day.  On top of this, I saw the patient on 02/11/2012, and she had frequency of urination and urinary symptoms. Culture was negative, presumably from prior antibiotic use. She was placed on Septra. She is not clear if she is improving  Past Medical History, Surgical History, Social History, Family History, Problem List, Medications, and Allergies have been reviewed and updated if relevant.  Current Outpatient Prescriptions on File Prior to Visit  Medication Sig Dispense Refill  . cephALEXin (KEFLEX) 500 MG capsule Take 2 capsules (1,000 mg total) by mouth 2 (two) times daily.  48 capsule  0  . ciprofloxacin (CIPRO) 250 MG tablet Take 1 tablet (250 mg total) by mouth 2 (two) times daily.  14 tablet  0  . doxycycline (VIBRA-TABS) 100 MG tablet Take 1 tablet (100 mg total) by mouth 2 (two) times daily.  28 tablet  0    Review of Systems: ROS: GEN: Acute illness details above GI: Tolerating PO intake GU: maintaining adequate hydration and urination Pulm: No SOB Interactive and getting along well at home.  Otherwise, ROS is as per the HPI.   Physical Examination: Filed Vitals:   02/13/12 1623  BP: 90/60  Pulse: 86  Temp: 98.5 F (36.9 C)   Filed  Vitals:   02/13/12 1623  Height: 5\' 5"  (1.651 m)  Weight: 137 lb (62.143 kg)   Body mass index is 22.80 kg/(m^2). Ideal Body Weight: Weight in (lb) to have BMI = 25: 149.9    GEN: WDWN, NAD, Non-toxic, A & O x 3 HEENT: Atraumatic, Normocephalic. Neck supple. No masses, No LAD. Ears and Nose: No external deformity. CV: RRR, No M/G/R. No JVD. No thrill. No extra heart sounds. PULM: CTA B, no wheezes, crackles, rhonchi. No retractions. No resp. distress. No accessory muscle use. EXTR: No c/c/e NEURO Normal gait.  PSYCH: Normally interactive. Conversant. Not depressed or anxious appearing.  Calm demeanor.   Skin: face with small lesion, broken, no fluctuance or induration  EKG / Xrays / Labs: None available at the time of encounter.  Assessment and Plan:  1. Cellulitis and abscess of face   2. UTI (lower urinary tract infection)    Prior history of MRSA, requiring IV antibiotics. We will place the patient on 14 days of Levaquin and doxycycline.  Attempted to obtain culture, however no pus extruded  Also given nasal mupiricin and topical chlorhexidine protocol.  Orders Today: No orders of the defined types were placed in this encounter.    Medications Today: Meds ordered this encounter  Medications  . levofloxacin (LEVAQUIN) 500 MG tablet    Sig: Take 1 tablet (500 mg total) by mouth daily.    Dispense:  14 tablet    Refill:  0     [name], MD

## 2012-02-13 NOTE — Telephone Encounter (Signed)
Caller: Izumi/Patient; Phone Number: [phone]; Message from caller: Pt calling today 02/13/12 was seen in office by Dr.  Sharen Hones for MRSA on face 01/28/12 and was put on Cephelaxin and Doxycycline x 2 weeks.  Area cleared up on right side of face.  Today has an area on left side of face, that is not as bad as the previous area on right side of face.  Pt was seen in office on 02/11/12 by Dr.  [place] Schimke for UTI and prescribed Sulfamethazole-TMPDS tablets (Bactrim) take 1 tablet BID.  PT requesting abx called in that she can take for longer period of time so hopefully clear the MRSA all the way up and also wants to know if she can take ABX for MRSA along with the Bactrim ABX for the UTI.  PLEASE CALL PT BACK AT [phone] TO ADVISE.  PT DOES NOT WISH TO COME IN FOR ANOTHER APPT.  Triager advised pt that it is office policy that no abx are called in, pt must be seen first.  Sending message per pt request.

## 2012-02-13 NOTE — Patient Instructions (Signed)
Mupiricin Ointment: in nose, after lesion clears up.  Coat inside of nose three times a day for a week  Chlorhexidine wash - once a day, put on and leave on a couple of minutes

## 2012-02-13 NOTE — Addendum Note (Signed)
Addended by: [name] on: 02/13/2012 02:23 PM   Modules accepted: Orders

## 2012-02-13 NOTE — Telephone Encounter (Signed)
rx sent to pharmacy. Patient has office visit at 4:00 pm

## 2012-02-13 NOTE — Addendum Note (Signed)
Addended by: Consuello Masse on: 02/13/2012 02:17 PM   Modules accepted: Orders

## 2012-02-13 NOTE — Telephone Encounter (Signed)
Call her now please --- call in cipro

## 2012-02-15 ENCOUNTER — Telehealth: Payer: Self-pay | Admitting: Family Medicine

## 2012-02-15 NOTE — Telephone Encounter (Signed)
Caller: Melissa Weiss/Patient; PCP: [name] T.; CB#: [phone]; ; ; Call regarding Insomnia;  Onset- 07/111/13 Pt states she started Levaquin and Doxycycline and is having problems with insomnia. She has taken Doxycycline before without a problem. She believes it is the Levaquin. She did not sleep at all last night and feels wired. Emergent s/s of Sleep disorders protocol r/o. See provider within 72 hrs. Pt wants to know if she should continue medication or switch to something else. States she did start out taking rx incorrectly, she thought it was more than once per day like Doxycycline but even after 1st pill she felt wired.

## 2012-02-15 NOTE — Telephone Encounter (Signed)
It is probably the levaquin but I want her to finish it as directed  It can make some people wired and jittery Avoid caffeine , try to relax in the evenings  Can try benadryl- and antihistamine at bedtime to help sleep 25-50 mg if needed  If this becomes intolerable, let me know

## 2012-02-19 NOTE — Telephone Encounter (Signed)
Informed patient as directed 

## 2012-08-26 ENCOUNTER — Ambulatory Visit: Payer: No Typology Code available for payment source | Admitting: Family Medicine

## 2012-11-17 ENCOUNTER — Telehealth: Payer: Self-pay | Admitting: *Deleted

## 2012-11-17 MED ORDER — CEPHALEXIN 500 MG PO CAPS: 1000.0000 mg | ORAL_CAPSULE | Freq: Two times a day (BID) | ORAL | Status: DC

## 2012-11-17 MED ORDER — SULFAMETHOXAZOLE-TRIMETHOPRIM 800-160 MG PO TABS: 2.0000 | ORAL_TABLET | Freq: Two times a day (BID) | ORAL | Status: DC

## 2012-11-17 NOTE — Telephone Encounter (Signed)
rx sent to pharmacy and patient advised. 

## 2012-11-17 NOTE — Telephone Encounter (Signed)
Given the circumstances, I think that is ok.  We will be thinking about her and her family.  But if she gets worse while on antibiotics, then our office should check her - I am going to put on 14 days of ABX  Keflex 500 mg, 2 tabs po bid, #48 Septra DS, 2 tabs po bid, #48

## 2012-11-17 NOTE — Telephone Encounter (Signed)
Patient calling says  She seen you in July for cellulitis on the face and you treated her with 2 antibiotic and it has come back again. Patient would be glad to come in but, her mother is in hospice and the arent expecting her to live but 1 to 2 more days and she doesn't want to leave her side. Patient asking if you would be willing to call her in antibiotics.

## 2013-01-02 ENCOUNTER — Telehealth: Payer: Self-pay

## 2013-01-02 NOTE — Telephone Encounter (Signed)
Pt left v/m; wanted Dr Patsy Lager to know that on 01/01/13 was diagnosed with breast cancer; pt had 6 biopsies. Pt said Dr Patsy Lager had been so helpful with her MRSA and the fact her mom passed away 6 weeks ago with CA she just wanted Dr Patsy Lager to know.

## 2013-01-07 NOTE — Telephone Encounter (Signed)
LMOM

## 2013-01-21 DIAGNOSIS — Z800 Family history of malignant neoplasm of digestive organs: Secondary | ICD-10-CM | POA: Insufficient documentation

## 2013-03-06 ENCOUNTER — Encounter: Payer: Self-pay | Admitting: *Deleted

## 2013-03-06 NOTE — Progress Notes (Signed)
Pt's husband brought in records and gave to Val.  Val gave to me and I took paperwork to Med Rec for chart.

## 2013-03-09 ENCOUNTER — Other Ambulatory Visit: Payer: Self-pay | Admitting: Medical Oncology

## 2013-03-09 DIAGNOSIS — C50919 Malignant neoplasm of unspecified site of unspecified female breast: Secondary | ICD-10-CM

## 2013-03-09 DIAGNOSIS — C50411 Malignant neoplasm of upper-outer quadrant of right female breast: Secondary | ICD-10-CM

## 2013-03-09 HISTORY — DX: Malignant neoplasm of upper-outer quadrant of right female breast: C50.411

## 2013-03-10 ENCOUNTER — Other Ambulatory Visit (HOSPITAL_BASED_OUTPATIENT_CLINIC_OR_DEPARTMENT_OTHER): Payer: No Typology Code available for payment source | Admitting: Lab

## 2013-03-10 ENCOUNTER — Ambulatory Visit: Payer: No Typology Code available for payment source

## 2013-03-10 ENCOUNTER — Ambulatory Visit (HOSPITAL_BASED_OUTPATIENT_CLINIC_OR_DEPARTMENT_OTHER): Payer: No Typology Code available for payment source | Admitting: Oncology

## 2013-03-10 ENCOUNTER — Encounter: Payer: Self-pay | Admitting: Oncology

## 2013-03-10 VITALS — BP 126/79 | HR 97 | Temp 98.1°F | Resp 20

## 2013-03-10 DIAGNOSIS — C50419 Malignant neoplasm of upper-outer quadrant of unspecified female breast: Secondary | ICD-10-CM

## 2013-03-10 DIAGNOSIS — C779 Secondary and unspecified malignant neoplasm of lymph node, unspecified: Secondary | ICD-10-CM

## 2013-03-10 DIAGNOSIS — C50919 Malignant neoplasm of unspecified site of unspecified female breast: Secondary | ICD-10-CM

## 2013-03-10 DIAGNOSIS — C50911 Malignant neoplasm of unspecified site of right female breast: Secondary | ICD-10-CM

## 2013-03-10 DIAGNOSIS — Z170 Estrogen receptor positive status [ER+]: Secondary | ICD-10-CM

## 2013-03-10 LAB — CBC WITH DIFFERENTIAL/PLATELET
BASO%: 0.2 % (ref 0.0–2.0)
Eosinophils Absolute: 0.1 10*3/uL (ref 0.0–0.5)
HCT: 36.9 % (ref 34.8–46.6)
HGB: 13 g/dL (ref 11.6–15.9)
LYMPH%: 23.1 % (ref 14.0–49.7)
MONO#: 0.4 10*3/uL (ref 0.1–0.9)
NEUT#: 3.8 10*3/uL (ref 1.5–6.5)
NEUT%: 68.4 % (ref 38.4–76.8)
Platelets: 182 10*3/uL (ref 145–400)
WBC: 5.6 10*3/uL (ref 3.9–10.3)
lymph#: 1.3 10*3/uL (ref 0.9–3.3)

## 2013-03-10 LAB — COMPREHENSIVE METABOLIC PANEL (CC13)
ALT: 9 U/L (ref 0–55)
CO2: 27 mEq/L (ref 22–29)
Calcium: 9.2 mg/dL (ref 8.4–10.4)
Chloride: 108 mEq/L (ref 98–109)
Creatinine: 0.7 mg/dL (ref 0.6–1.1)
Glucose: 87 mg/dl (ref 70–140)
Total Bilirubin: 0.45 mg/dL (ref 0.20–1.20)
Total Protein: 6.9 g/dL (ref 6.4–8.3)

## 2013-03-10 NOTE — Progress Notes (Signed)
Checked in new patient with financial issues. She gave email for communication- mchart. She doesn't have poa/living will yet.

## 2013-03-13 ENCOUNTER — Ambulatory Visit
Admission: RE | Admit: 2013-03-13 | Discharge: 2013-03-13 | Disposition: A | Discharge status: Home / Self Care | Payer: No Typology Code available for payment source | Source: Ambulatory Visit | Attending: Radiation Oncology | Admitting: Radiation Oncology

## 2013-03-13 ENCOUNTER — Encounter: Payer: Self-pay | Admitting: Radiation Oncology

## 2013-03-13 DIAGNOSIS — C50911 Malignant neoplasm of unspecified site of right female breast: Secondary | ICD-10-CM

## 2013-03-13 DIAGNOSIS — Z170 Estrogen receptor positive status [ER+]: Secondary | ICD-10-CM | POA: Insufficient documentation

## 2013-03-13 DIAGNOSIS — C50919 Malignant neoplasm of unspecified site of unspecified female breast: Secondary | ICD-10-CM | POA: Insufficient documentation

## 2013-03-13 NOTE — Progress Notes (Signed)
Radiation Oncology         [phone] ________________________________  Initial outpatient Consultation - Date: 03/13/2013   Name: Melissa Weiss MRN: [id number]   DOB: April 20, 1981  REFERRING PHYSICIAN: Victorino December, MD  DIAGNOSIS: T2N0 right breast cancer  HISTORY OF PRESENT ILLNESS::Melissa Weiss is a 32 y.o. female  palpated a right breast mass a year ago. Mammogram was negative. Ultrasound showed a suspicious lymph node. Followup was recommended. She underwent mammogram and ultrasound in may of this year. Ultrasound was concerning for an enlarged lymph node. A biopsy was performed at that time which showed an invasive ductal carcinoma.  An MRI of the bilateral breasts was performed on 01/06/2013. This showed 2 enhancing masses in the upper outer quadrant of the right breast measuring 0.9 x 0.9 x 0.9 cm and 1.4 x 0.8 x 1.3 cm. Together the total dose since of these masses was 2.8 cm. A clumped area of non-masslike enhancement was present 2 cm inferior to this. Biopsy was recommended. In the left breast multiple scattered enhancing foci were noted with a 5 mm of enhancement in the central left breast. No suspicious lymphadenopathy was noted. An MRI biopsy of the breast was performed on 01/09/2013. The left biopsy was normal breast parenchyma with no evidence of malignancy. 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 Excision was recommended. She underwent needle localized lumpectomy and sentinel lymph node biopsy on 01/15/2013. The area of concern for atypical hyperplasia is showed breast tissue with no evidence of in situ or invasive carcinoma. The superior lesion showed grade 1 invasive adenocarcinoma measuring 2.9 cm. This was felt to be 1 contiguous mass. The margins were positive at the anterior and inferior margins. The posterior margin was close at 0.5 mm for DCIS. She underwent reexcision on 02/23/2013. Microscopic foci of atypical ductal  hyperplasia likely representing "trace residual ductal carcinoma in situ" with no evidence of residual invasive carcinoma was noted. All inked margins were negative. It is not clear her how close the residual ADH/DCIS came to the margins but the specimen measured 1.3 cm in width. Her tumor was ER/PR positive and HER-2 negative. An Oncotype test was sent and was low. She will not be receiving chemotherapy. I should also note that a sentinel lymph node removed at the time of her original surgery was negative for malignancy. Total androgen ablation has been recommended. For this reason she is going to undergo harvest of her 80s and freezing of embryonal his. She is going to have that starting later this month with final ovarian harvest on September 2. In Dr. Welton Flakes have discussed this. She would like to proceed on with radiation after this is performed. I should also note that at the time of her diagnosis she had recently lost her mother to appendiceal carcinoma. 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 In regards to her gynecologic history she had her last menses in May. She has regular periods. She had menarche at 71 and is G1 P1 with her first pregnancy at 66. She did breast feed.  PREVIOUS RADIATION THERAPY: No  PAST MEDICAL HISTORY:  has a past medical history of Anemia; Colitis; Other acne; Anxiety state, unspecified; Lipoma of unspecified site; Neoplasm of unspecified nature of bone, soft tissue, and skin; Personal history of contact with and (suspected) exposure to potentially hazardous body fluids; Ovarian cyst (2000); Adnexal mass (2000); Colon polyp; H/O urticaria; Folliculitis (3/08); and  Allergy.    PAST SURGICAL HISTORY: Past Surgical History  Procedure Laterality Date  . Tonsillectomy  1998  . Colonoscopy  6/00    few polyps; bx neg  . Esophagogastroduodenoscopy  7/00    Neg  . Flexible sigmoidoscopy  3/01    Neg    FAMILY HISTORY: Prostate  cancer in a maternal grandfather leukemia in her paternal aunt and a mother with appendiceal carcinoma at the age of 32.  SOCIAL HISTORY:  History  Substance Use Topics  . Smoking status: Never Smoker   . Smokeless tobacco: Not on file  . Alcohol Use: No    ALLERGIES: Azithromycin; Codeine; Erythromycin; Ferrous sulfate; Lidocaine; Promethazine hcl; and Vicodin  MEDICATIONS:  Current Outpatient Prescriptions  Medication Sig Dispense Refill  . ibuprofen (ADVIL,MOTRIN) 400 MG tablet Take 400 mg by mouth every 6 (six) hours as needed for pain.       No current facility-administered medications for this encounter.    REVIEW OF SYSTEMS:  A 15 point review of systems is documented in the electronic medical record. This was obtained by the nursing staff. However, I reviewed this with the patient to discuss relevant findings and make appropriate changes.  Pertinent items are noted in HPI.   PHYSICAL EXAM: There were no vitals filed for this visit.. . She is a pleasant female in no distress sitting comfortably examining table. She has a Cosmetic result. Her scars are well-healed on the right breast. She just has a biopsy site scar on the left breast which is also well healed. She has a very small seroma cavity in the upper outer quadrant of the right breast. No palpable abnormalities of the left breast. No palpable axillary adenopathy bilaterally. No palpable cervical adenopathy.  LABORATORY DATA:  Lab Results  Component Value Date   WBC 5.6 03/10/2013   HGB 13.0 03/10/2013   HCT 36.9 03/10/2013   MCV 91.4 03/10/2013   PLT 182 03/10/2013   Lab Results  Component Value Date   NA 144 03/10/2013   K 3.8 03/10/2013   CL 102 10/09/2007   CO2 27 03/10/2013   Lab Results  Component Value Date   ALT 9 03/10/2013   AST 13 03/10/2013   ALKPHOS 62 03/10/2013   BILITOT 0.45 03/10/2013     RADIOGRAPHY: No results found.    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 We discussed the role of radiation and decreasing local failures in patients who undergo breast conservation. We discussed the process of simulation the placement tattoos. We discussed the use of 3-D conformal radiation therapy to minimize the dose to her lungs. We discussed the low likelihood of symptomatic lung and rib damage. We discussed the low likelihood of secondary malignancies. We discussed possible side effects of treatment including but not limited to skin redness and fatigue. We discussed decreased cosmetic outcome and asymmetry as a possible long-term consequence of radiation. I have encouraged her to contact me with her fertility issues are resolved and she is ready to begin treatment. She understands that will take [place] about a week to get a treatment plan together once we get started.  I spent 40 minutes  face to face with the patient and more than 50% of that time was spent in counseling and/or coordination of care.   ------------------------------------------------  Lurline Hare, MD

## 2013-03-13 NOTE — Progress Notes (Addendum)
Location of Breast Cancer Right bx  12/30/12::RIGHT BREAST AXILLA NEEDLE CORE BIOPSIES CONTAINING INVASIVE DUCTAL ADENOCARCINOMA WITH TUBULAR FEATURES [phone], BLOCK A1).FORMALIN FIXATION TIME = 5 HRS 5 MINS. ER Estimated Cytosol      AMENDMENT: 01/01/2013 Phill Mutter, M.D. Review of the biopsy results in conjunction with the imaging findings demonstrated: A. "RIGHT BREAST, AXILLA" (ULTRASOUND GUIDED NEEDLE CORE BIOPSY):  INVASIVE ADENOCARCINOMA OF THE BREAST.HISTOLOGIC TYPE: DUCTAL, WITH TUBULAR FEATURES, SEE COMMENT. NOTTINGHAM COMBINED HISTOLOGIC GRADE: 1 OF 3.TUBULE FORMATION SCORE: 1.NUCLEAR PLEOMORPHISM SCORE: 1. MITOTIC RATE SCORE: 1.IN-SITU CARCINOMA: PRESENT.TYPE OF IN-SITU CARCINOMA: CRIBRIFORM.NUCLEAR GRADE OF IN-SITU CARCINOMA: 1.   Histology per Pathology Report: 7/21/14A. RIGHT BREAST, ANTERIOR, LATERAL, INFERIOR, AND POSTERIOR MARGINS,RE-EXCISION:  MICROSCOPIC FOCI OF ATYPICAL DUCTAL HYPERPLASIA LIKELY REPRESENTING TRACERESIDUAL DUCTAL CARCINOMA IN SITU (NOT AT MARGIN).NO EVIDENCE OF RESIDUAL INVASIVE CARCINOMA. ALL FINAL INKED MARGINS ARE NEGATIVE.      Receptor Status: ER(+), PR (-), Her2-neu (-)  Did patient present with symptoms (if so, please note symptoms) or was this found on screening mammography? Patient felt lumps both breasts Past/Anticipated interventions by surgeon, if any: 3 excisions right breast   Past/Anticipated interventions by medical oncology, if any: Chemotherapy , Oncotype score=7,   Lymphedema issues, if any:  No Pain issues, if any:  No SAFETY ISSUES:  Prior radiation? no  Pacemaker/ICD? no  Possible current pregnancy?no,  wants to preserve eggs to be done around Sept 2nd, injections to start 03/28/13, Dr. Karlene Lineman    Is the patient on methotrexate? no  Current Complaints / other details: Married, GxP1,,menses age 55, 1st live birth age 14, has heart murmur, ,cCAD,Mrsa+ 11/2012,,alopecia,   Problem Relation Age of Onset Hypertension  Father 32 Coronary artery disease Father 29 Alzheimer's disease Paternal Grandmother Coronary artery disease Paternal Grandmother Ovarian cancer OtherUterine cancer  Mother , appendix  cancer, mets to uterus,ovaries,lung dx age 38, stage IV died 1 years , Prostate cancer Maternal Grandfather & colon cancer, CHF , Heart failure Maternal Grandfather Coronary artery disease Maternal Grandfather 50 caused death at age 47 Leukemia Paternal Aunt living age 60 dx 1 year ago      Lowella Petties, RN 03/13/2013,8:29 AM

## 2013-03-29 NOTE — Progress Notes (Signed)
Melissa Weiss [id number] [birth year] 32 y.o. 03/29/2013 6:03 PM  CC  [name], MD [address] [place] [address] [place] [address] Dr. Lurline Hare  REASON FOR CONSULTATION:  32 year old female with new diagnosis of invasive ductal carcinoma of the right breast diagnosed 01/01/2013  STAGE:   Breast cancer   Primary site: Breast (Right)   Staging method: AJCC 7th Edition   Pathologic: Stage IIA (T2, N0, cM0) signed by Lurline Hare, MD on 03/13/2013  5:54 PM   Summary: Stage IIA (T2, N0, cM0)  REFERRING PHYSICIAN: Dr. Dallas Schimke  HISTORY OF PRESENT ILLNESS:  Melissa Weiss is a 32 y.o. female.  palpated a right breast mass a year ago. Mammogram was negative. Ultrasound showed a suspicious lymph node. Followup was recommended. She underwent mammogram and ultrasound in may of this year. Ultrasound was concerning for an enlarged lymph node. A biopsy was performed at that time which showed an invasive ductal carcinoma.  An MRI of the bilateral breasts was performed on 01/06/2013. This showed 2 enhancing masses in the upper outer quadrant of the right breast measuring 0.9 x 0.9 x 0.9 cm and 1.4 x 0.8 x 1.3 cm. Together the total dose since of these masses was 2.8 cm. A clumped area of non-masslike enhancement was present 2 cm inferior to this. Biopsy was recommended. In the left breast multiple scattered enhancing foci were noted with a 5 mm of enhancement in the central left breast. No suspicious lymphadenopathy was noted. An MRI biopsy of the breast was performed on 01/09/2013. The left biopsy was normal breast parenchyma with no evidence of malignancy. 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 Excision was recommended. She underwent needle localized lumpectomy and sentinel lymph node biopsy on 01/15/2013. The area of concern for atypical hyperplasia is showed breast tissue with no evidence of in situ or invasive  carcinoma. The superior lesion showed grade 1 invasive adenocarcinoma measuring 2.9 cm. This was felt to be 1 contiguous mass. The margins were positive at the anterior and inferior margins. The posterior margin was close at 0.5 mm for DCIS. She underwent reexcision on 02/23/2013. Microscopic foci of atypical ductal hyperplasia likely representing "trace residual ductal carcinoma in situ" with no evidence of residual invasive carcinoma was noted. All inked margins were negative. It is not clear her how close the residual ADH/DCIS came to the margins but the specimen measured 1.3 cm in width. Her tumor was ER/PR positive and HER-2 negative. An Oncotype test was sent and was low. She will not be receiving chemotherapy. I should also note that a sentinel lymph node removed at the time of her original surgery was negative for malignancy. Total androgen ablation has been recommended. For this reason she is going to undergo harvest of her 80s and freezing of embryonal his. She is going to have that starting later this month with final ovarian harvest on September 2.   She is without any complaints   Past Medical History: Past Medical History  Diagnosis Date  . Anemia   . Colitis   . Other acne   . Anxiety state, unspecified   . Lipoma of unspecified site   . Neoplasm of unspecified nature of bone, soft tissue, and skin   . Personal history of contact with and (suspected) exposure to potentially hazardous body fluids   . Ovarian cyst 2000  . Adnexal mass 2000  . Colon polyp     benign  .  H/O urticaria   . Folliculitis 3/08    pseudomonas  . Allergy     Past Surgical History: Past Surgical History  Procedure Laterality Date  . Tonsillectomy  1998  . Colonoscopy  6/00    few polyps; bx neg  . Esophagogastroduodenoscopy  7/00    Neg  . Flexible sigmoidoscopy  3/01    Neg    Family History: No family history on file.  Social History History  Substance Use Topics  . Smoking status:  Never Smoker   . Smokeless tobacco: Not on file  . Alcohol Use: No    Allergies: Allergies  Allergen Reactions  . Azithromycin     REACTION: nausea  . Codeine Nausea Only    REACTION: reaction not known  . Erythromycin     REACTION: nausea  . Ferrous Sulfate     REACTION: nausea  . Lidocaine     REACTION: swelling in mouth  . Promethazine Hcl     REACTION: redness and swelling at site if injected  . Vicodin [Hydrocodone-Acetaminophen] Nausea And Vomiting    Current Medications: Current Outpatient Prescriptions  Medication Sig Dispense Refill  . ibuprofen (ADVIL,MOTRIN) 400 MG tablet Take 400 mg by mouth every 6 (six) hours as needed for pain.       No current facility-administered medications for this visit.    OB/GYN History:menarche at age 47 she is premenopausal she's had 1 term birth at 84. She is trying to get pregnant again and is working with a Pharmacologist.  Fertility Discussion:  Yes extensive 40 minute Prior History of Cancer:no  Health Maintenance:  Colonoscopy no Bone Density no Last PAP smear yes  ECOG PERFORMANCE STATUS: 0 - Asymptomatic  Genetic Counseling/testing: done outside  REVIEW OF SYSTEMS:  A comprehensive review of systems was negative.  PHYSICAL EXAMINATION: Blood pressure 126/79, pulse 97, temperature 98.1 F (36.7 C), temperature source Oral, resp. rate 20.  UJW:JXBJY, healthy, no distress, well nourished and well developed SKIN: skin color, texture, turgor are normal HEAD: Normocephalic EYES: PERRLA, EOMI EARS: External ears normal OROPHARYNX:no exudate, no erythema and lips, buccal mucosa, and tongue normal  NECK: supple, no adenopathy, thyroid normal size, non-tender, without nodularity LYMPH:  no palpable lymphadenopathy BREAST:left breast normal without mass, skin or nipple changes or axillary nodes LUNGS: clear to auscultation and percussion HEART: regular rate & rhythm ABDOMEN:abdomen soft, non-tender, obese and  normal bowel sounds BACK: No CVA tenderness EXTREMITIES:no edema, no clubbing, no cyanosis  NEURO: alert & oriented x 3 with fluent speech, no focal motor/sensory deficits, gait normal     STUDIES/RESULTS: No results found.   LABS:    Chemistry      Component Value Date/Time   NA 144 03/10/2013 1503   NA 135 10/09/2007 1534   K 3.8 03/10/2013 1503   K 3.3* 10/09/2007 1534   CL 102 10/09/2007 1534   CO2 27 03/10/2013 1503   CO2 25 10/09/2007 1534   BUN 8.3 03/10/2013 1503   BUN 3* 10/09/2007 1534   CREATININE 0.7 03/10/2013 1503   CREATININE 0.48 10/09/2007 1534      Component Value Date/Time   CALCIUM 9.2 03/10/2013 1503   CALCIUM 8.4 10/09/2007 1534   ALKPHOS 62 03/10/2013 1503   ALKPHOS 133* 10/09/2007 1534   AST 13 03/10/2013 1503   AST 22 10/09/2007 1534   ALT 9 03/10/2013 1503   ALT 17 10/09/2007 1534   BILITOT 0.45 03/10/2013 1503   BILITOT 1.0 10/09/2007 1534  Lab Results  Component Value Date   WBC 5.6 03/10/2013   HGB 13.0 03/10/2013   HCT 36.9 03/10/2013   MCV 91.4 03/10/2013   PLT 182 03/10/2013       PATHOLOGY:  ASSESSMENT    32 year old female with  #1 new diagnosis of right breast cancer measuring 2.9 cm no LV high grade 101 lymph nodes positive for metastatic disease clinical stage II. Tumor was ER positive PR positive HER-2/neu negative with a Ki-67 of 27%. Patient had an Oncotype DX performed the score was low.  #2 patient is a good candidate for adjuvant radiation therapy followed by antiestrogen therapy.  #3 patient is interested in preserving her fertility she is very anxious to have another baby. She is meeting with fertility experts in the next few days. She wants to try to harvest her aches and have in vitro fertilization and store her embryonal is for later use.  Clinical Trial Eligibility: no Multidisciplinary conference discussion no     PLAN:    #1 patient will proceed with fertility preservation. We discussed risks benefits regarding this. She has an appointment  set up in [place] with one of the physicians there. She understands that this could be risky but she is very much informed regarding her risks as well as benefits. She states that her heart wants her to have another baby. Her husband concurs with her.  #2 once patient finishes her fertility treatments then she will proceed with radiation therapy.  #3 we discussed adjuvant antiestrogen therapy and the rationale for it. We discussed risks and benefits. Total of 10 years of tamoxifen will be recommended.        Discussion: Patient is being treated per NCCN breast cancer care guidelines appropriate for stage.II   Thank you so much for allowing me to participate in the care of OLUWATOMISIN HUSTEAD. I will continue to follow up the patient with you and assist in her care.  All questions were answered. The patient knows to call the clinic with any problems, questions or concerns. We can certainly see the patient much sooner if necessary.  I spent 60 minutes counseling the patient face to face. The total time spent in the appointment was 60 minutes.  Drue Second, MD Medical/Oncology [facility] [phone] (beeper) [phone] (Office)

## 2013-04-03 ENCOUNTER — Telehealth: Payer: Self-pay | Admitting: *Deleted

## 2013-04-03 NOTE — Telephone Encounter (Signed)
Pat called and left voice message on phone, asking questions needing to be answered by MD, she stated" she had a patient of hers that is going to [facility] and is only getting 12 txs to her breast and is lying face down and was told that was the best way to radiate her breast, and that is the same kind of breast cancer ms Herandez has,  " and she would like a call back today  will notify Md, her phone number is: [phone] 11:50 AM

## 2013-04-17 ENCOUNTER — Ambulatory Visit
Admission: RE | Admit: 2013-04-17 | Discharge: 2013-04-17 | Disposition: A | Discharge status: Home / Self Care | Payer: No Typology Code available for payment source | Source: Ambulatory Visit | Attending: Radiation Oncology | Admitting: Radiation Oncology

## 2013-04-17 DIAGNOSIS — C50911 Malignant neoplasm of unspecified site of right female breast: Secondary | ICD-10-CM

## 2013-04-17 DIAGNOSIS — Z510 Encounter for antineoplastic radiation therapy: Secondary | ICD-10-CM | POA: Insufficient documentation

## 2013-04-17 DIAGNOSIS — K7689 Other specified diseases of liver: Secondary | ICD-10-CM | POA: Insufficient documentation

## 2013-04-17 DIAGNOSIS — R918 Other nonspecific abnormal finding of lung field: Secondary | ICD-10-CM | POA: Insufficient documentation

## 2013-04-17 DIAGNOSIS — R599 Enlarged lymph nodes, unspecified: Secondary | ICD-10-CM | POA: Insufficient documentation

## 2013-04-17 DIAGNOSIS — C50919 Malignant neoplasm of unspecified site of unspecified female breast: Secondary | ICD-10-CM | POA: Insufficient documentation

## 2013-04-17 LAB — ESTRADIOL: Estradiol: 77.8 pg/mL

## 2013-04-17 NOTE — Progress Notes (Signed)
Phone number for fertility specialist Dr.Wahlmer [phone] and fax [phone].Estradiol results to be faxed when ready.

## 2013-04-17 NOTE — Progress Notes (Signed)
Name: DEATRICE SPANBAUER   MRN: [id number]  Date:  04/17/2013  DOB: 27-Aug-1980  Status:outpatient    DIAGNOSIS: Breast cancer.  CONSENT VERIFIED: yes   SET UP: Patient is setup supine   IMMOBILIZATION:  The following immobilization was used:Custom Moldable Pillow, breast board.   NARRATIVE: Ms. Franson was brought to the CT Simulation planning suite.  Identity was confirmed.  All relevant records and images related to the planned course of therapy were reviewed.  Then, the patient was positioned in a stable reproducible clinical set-up for radiation therapy.  Wires were placed to delineate the clinical extent of breast tissue. A wire was placed on the scar as well.  CT images were obtained.  An isocenter was placed. Skin markings were placed.  The CT images were loaded into the planning software where the target and avoidance structures were contoured.  The radiation prescription was entered and confirmed. The patient was discharged in stable condition and tolerated simulation well.    TREATMENT PLANNING NOTE:  Treatment planning then occurred. I have requested : MLC's, isodose plan, basic dose calculation  I personally designed and supervised the construction of 3 medically necessary complex treatment devices for the protection of critical normal structures including the lungs and contralateral breast as well as the immobilization device which is necessary for set up certainty.

## 2013-04-21 ENCOUNTER — Telehealth: Payer: Self-pay | Admitting: Medical Oncology

## 2013-04-21 ENCOUNTER — Telehealth: Payer: Self-pay | Admitting: *Deleted

## 2013-04-21 NOTE — Telephone Encounter (Signed)
Returned call from pt who states Dr Michell Heinrich ordered labs which pt had done on 04/17/13, and she has not received results. She is requesting those results so she can call her gyn dr. Pt had estradiol drawn, gave her results verbally. She verbalized understanding and appreciation.

## 2013-04-21 NOTE — Telephone Encounter (Signed)
Patient LVMOM stating she saw IVF MD to have her eggs fertilized, states out of the 23 eggs harvested, 12 fertilized, 3 made it and now none survived, patient states she is "devestated." Scheduled to start radiation next Monday and states Dr Michell Heinrich does not wish for her to delay radiation d/t IVF. Patient requesting advice from Dr Welton Flakes.  LOV with Dr Welton Flakes 08/05.  Radiation sched 08/22  mssg forwarded to MD for review.

## 2013-04-21 NOTE — Telephone Encounter (Signed)
Spoke with patient to inform her that her message was forwarded to MD for review. Patient wanted me to add to her message for Dr Welton Flakes that she had her estradiol lab checked and resulted at 77.8  - and she has been out of letrozole x3 days now, asking should she continue or stop.  mssg sent to MD.

## 2013-04-21 NOTE — Telephone Encounter (Signed)
Melissa Weiss- Patient gave sim her physician's fax number to send this lab. Can you call sim and get that number and fax it to her physician?  Thanks! SW

## 2013-04-24 ENCOUNTER — Encounter: Payer: Self-pay | Admitting: Radiation Oncology

## 2013-04-27 ENCOUNTER — Ambulatory Visit
Admission: RE | Admit: 2013-04-27 | Discharge: 2013-04-27 | Disposition: A | Discharge status: Home / Self Care | Payer: No Typology Code available for payment source | Source: Ambulatory Visit | Attending: Radiation Oncology | Admitting: Radiation Oncology

## 2013-04-27 DIAGNOSIS — C50911 Malignant neoplasm of unspecified site of right female breast: Secondary | ICD-10-CM

## 2013-04-28 ENCOUNTER — Ambulatory Visit
Admission: RE | Admit: 2013-04-28 | Discharge: 2013-04-28 | Disposition: A | Discharge status: Home / Self Care | Payer: No Typology Code available for payment source | Source: Ambulatory Visit | Attending: Radiation Oncology | Admitting: Radiation Oncology

## 2013-04-28 DIAGNOSIS — C50911 Malignant neoplasm of unspecified site of right female breast: Secondary | ICD-10-CM

## 2013-04-28 MED ORDER — RADIAPLEXRX EX GEL
Freq: Once | CUTANEOUS | Status: AC
  Administered 2013-04-28: 18:00:00 via TOPICAL

## 2013-04-28 MED ORDER — ALRA NON-METALLIC DEODORANT (RAD-ONC)
1.0000 "application " | Freq: Once | TOPICAL | Status: AC
  Administered 2013-04-28: 1 via TOPICAL

## 2013-04-28 NOTE — Progress Notes (Signed)
Weekly Management Note Current Dose: 1.8  Gy  Projected Dose: 45 Gy   Narrative:  The patient presents for routine under treatment assessment.  CBCT/MVCT images/Port film x-rays were reviewed.  The chart was checked. Nervous about lung dose. RN education performed  Physical Findings: Weight:  . Unchanged  Impression:  The patient is tolerating radiation.  Plan:  Continue treatment as planned. Discussed lung dose and showed her plan.

## 2013-04-28 NOTE — Progress Notes (Signed)
  Radiation Oncology         [phone] ________________________________  Name: Melissa Weiss MRN: [id number]  Date: 04/27/2013  DOB: [birth year]  Simulation Verification Note  Status: outpatient  NARRATIVE: The patient was brought to the treatment unit and placed in the planned treatment position. The clinical setup was verified. Then port films were obtained and uploaded to the radiation oncology medical record software.  The treatment beams were carefully compared against the planned radiation fields. The position location and shape of the radiation fields was reviewed. The targeted volume of tissue appears appropriately covered by the radiation beams. Organs at risk appear to be excluded as planned.  Based on my personal review, I approved the simulation verification. The patient's treatment will proceed as planned.  ------------------------------------------------  Lurline Hare, MD

## 2013-04-29 ENCOUNTER — Ambulatory Visit
Admission: RE | Admit: 2013-04-29 | Discharge: 2013-04-29 | Disposition: A | Discharge status: Home / Self Care | Payer: No Typology Code available for payment source | Source: Ambulatory Visit | Attending: Radiation Oncology | Admitting: Radiation Oncology

## 2013-04-29 MED ORDER — ALRA NON-METALLIC DEODORANT (RAD-ONC): 1.0000 "application " | Freq: Once | TOPICAL | Status: AC

## 2013-04-29 MED ORDER — RADIAPLEXRX EX GEL: Freq: Once | CUTANEOUS | Status: AC

## 2013-04-29 NOTE — Progress Notes (Signed)
Late Entry:  On yesterday reviewed side effect management regarding radiation to the breast inclusive of skin care, pain, and fatigue.  Melissa Weiss had many questions regarding the angles in which the radiation was administered, and after care/assessment with checkups with Dr. Michell Heinrich as well as MRI's which start ~ 4-6 months after completion of treatment.  Explained that her dose is delivered at oblique angles, therefore the dose is scheming across the chest, but is moving away from her chest, and is encompasses all breast tissue .  Informed her that the lower area of her axilla ( lower edge) will receive some dose during her treatment phase.  Given Radiaplex with instructions to use BID, after treatment and at bedtime.  Also given Alra deodorant with instructions apply to one area in the axilla with her fingers as a test patch to ensure she can tolerate this product and she agreed.  Informed her that Dr. Michell Heinrich sees her patients, once weekly, on Tuesdays following her treatment, but assured her if she has any concerns at any time, she can be seen by Dr. Michell Heinrich or the covering physician if Dr. Michell Heinrich is not available.  She admitted to being scared because of her age and the fact that she felt the nodule in her breast, but had  a delay in being diagnosed.

## 2013-04-29 NOTE — Addendum Note (Signed)
Encounter addended by: Delynn Flavin, RN on: 04/29/2013  1:34 PM<BR>     Documentation filed: Notes Section, Orders

## 2013-04-30 ENCOUNTER — Ambulatory Visit
Admission: RE | Admit: 2013-04-30 | Discharge: 2013-04-30 | Disposition: A | Discharge status: Home / Self Care | Payer: No Typology Code available for payment source | Source: Ambulatory Visit | Attending: Radiation Oncology | Admitting: Radiation Oncology

## 2013-05-01 ENCOUNTER — Ambulatory Visit
Admission: RE | Admit: 2013-05-01 | Discharge: 2013-05-01 | Disposition: A | Discharge status: Home / Self Care | Payer: No Typology Code available for payment source | Source: Ambulatory Visit | Attending: Radiation Oncology | Admitting: Radiation Oncology

## 2013-05-04 ENCOUNTER — Ambulatory Visit
Admission: RE | Admit: 2013-05-04 | Discharge: 2013-05-04 | Disposition: A | Discharge status: Home / Self Care | Payer: No Typology Code available for payment source | Source: Ambulatory Visit | Attending: Radiation Oncology | Admitting: Radiation Oncology

## 2013-05-05 ENCOUNTER — Ambulatory Visit
Admission: RE | Admit: 2013-05-05 | Discharge: 2013-05-05 | Disposition: A | Discharge status: Home / Self Care | Payer: No Typology Code available for payment source | Source: Ambulatory Visit | Attending: Radiation Oncology | Admitting: Radiation Oncology

## 2013-05-05 ENCOUNTER — Encounter: Payer: Self-pay | Admitting: Radiation Oncology

## 2013-05-05 VITALS — BP 108/75 | HR 87 | Temp 98.3°F | Resp 20 | Wt 138.2 lb

## 2013-05-05 DIAGNOSIS — C50911 Malignant neoplasm of unspecified site of right female breast: Secondary | ICD-10-CM

## 2013-05-05 NOTE — Progress Notes (Signed)
Weekly rad txs right breast 6 completed, no skin changes noted, tenderness where incision  Is  States patient, using radiaplex bid, appetite  Good, hydrated , tired though, swelling slight, "feels more full" 11:29 AM

## 2013-05-05 NOTE — Progress Notes (Signed)
[facility]    Radiation Oncology [address] [place]     Maryln Gottron, M.D. [place], [place] [address]               Billie Lade, M.D., Ph.D. Phone: [phone]      Molli Hazard A. Kathrynn Running, M.D. Fax: [phone]      Radene Gunning, M.D., Ph.D.         Lurline Hare, M.D.         Grayland Jack, M.D Weekly Treatment Management Note  Name: Melissa Weiss     MRN: [id number]        CSN: [id number] Date: 05/05/2013      DOB: 10/25/1980  CC: [name], MD         [place]    Status: Outpatient  Diagnosis: The encounter diagnosis was Breast cancer, right.  Current Dose: 10.8 Gy  Current Fraction: 6  Planned Dose: 45 + Gy  Narrative: Melissa Weiss was seen today for weekly treatment management. The chart was checked and port films  were reviewed. She is tolerating treatments well this time without any significant fatigue. She's noticed some mild itching in towards the right axillary region  Azithromycin; Codeine; Erythromycin; Ferrous sulfate; Lidocaine; Promethazine hcl; and Vicodin  Current Outpatient Prescriptions  Medication Sig Dispense Refill  . hyaluronate sodium (RADIAPLEXRX) GEL Apply 1 application topically 2 (two) times daily.      Marland Kitchen ibuprofen (ADVIL,MOTRIN) 400 MG tablet Take 400 mg by mouth every 6 (six) hours as needed for pain.      . non-metallic deodorant Thornton Papas) MISC Apply 1 application topically daily as needed.       No current facility-administered medications for this encounter.   Facility-Administered Medications Ordered in Other Encounters  Medication Dose Route Frequency Provider Last Rate Last Dose  . hyaluronate sodium (RADIAPLEXRX) gel   Topical Once Lurline Hare, MD      . non-metallic deodorant Thornton Papas) 1 application  1 application Topical Once Lurline Hare, MD       Labs:  Lab Results  Component Value Date   WBC 5.6 03/10/2013   HGB 13.0 03/10/2013   HCT 36.9 03/10/2013   MCV 91.4 03/10/2013   PLT 182 03/10/2013   Lab  Results  Component Value Date   CREATININE 0.7 03/10/2013   BUN 8.3 03/10/2013   NA 144 03/10/2013   K 3.8 03/10/2013   CL 102 10/09/2007   CO2 27 03/10/2013   Lab Results  Component Value Date   ALT 9 03/10/2013   AST 13 03/10/2013   BILITOT 0.45 03/10/2013    Physical Examination:  weight is 138 lb 3.2 oz (62.687 kg). Her oral temperature is 98.3 F (36.8 C). Her blood pressure is 108/75 and her pulse is 87. Her respiration is 20.    Wt Readings from Last 3 Encounters:  05/05/13 138 lb 3.2 oz (62.687 kg)  02/13/12 137 lb (62.143 kg)  02/11/12 137 lb 4 oz (62.256 kg)    The right breast area shows minimal hyperpigmentation changes. Lungs - Normal respiratory effort, chest expands symmetrically. Lungs are clear to auscultation, no crackles or wheezes.  Heart has regular rhythm and rate  Abdomen is soft and non tender with normal bowel sounds  Assessment:  Patient tolerating treatments well  Plan: Continue treatment per original radiation prescription

## 2013-05-06 ENCOUNTER — Ambulatory Visit
Admission: RE | Admit: 2013-05-06 | Discharge: 2013-05-06 | Disposition: A | Discharge status: Home / Self Care | Payer: No Typology Code available for payment source | Source: Ambulatory Visit | Attending: Radiation Oncology | Admitting: Radiation Oncology

## 2013-05-07 ENCOUNTER — Ambulatory Visit
Admission: RE | Admit: 2013-05-07 | Discharge: 2013-05-07 | Disposition: A | Discharge status: Home / Self Care | Payer: No Typology Code available for payment source | Source: Ambulatory Visit | Attending: Radiation Oncology | Admitting: Radiation Oncology

## 2013-05-07 ENCOUNTER — Telehealth: Payer: Self-pay | Admitting: Oncology

## 2013-05-07 VITALS — BP 123/75 | HR 89 | Temp 98.1°F

## 2013-05-07 DIAGNOSIS — C50911 Malignant neoplasm of unspecified site of right female breast: Secondary | ICD-10-CM

## 2013-05-07 NOTE — Progress Notes (Signed)
[facility]    Radiation Oncology [address] [place]     Maryln Gottron, M.D. [place], [place] [address]               Billie Lade, M.D., Ph.D. Phone: [phone]      Molli Hazard A. Kathrynn Running, M.D. Fax: [phone]      Radene Gunning, M.D., Ph.D.         Lurline Hare, M.D.         Grayland Jack, M.D Weekly Treatment Management Note  Name: Melissa Weiss     MRN: [id number]        CSN: [id number] Date: 05/07/2013      DOB: 06/23/1981  CC: Melissa Beat, MD         [place]    Status: Outpatient  Diagnosis: The encounter diagnosis was Breast cancer, right.  Current Dose: 14.4 Gy  Current Fraction: 8  Planned Dose: 45+ Gy  Narrative: Melissa Weiss was seen today for weekly treatment management. The chart was checked and port films  were reviewed. The patient asked to be seen today. Earlier this week she developed a boil along her lower face which ruptured yesterday. Since that time the patient has felt miserable. 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 The patient most recently was treated with Septra DS and Keflex for her skin infection by her primary care physician with good results.  Azithromycin; Codeine; Erythromycin; Ferrous sulfate; Lidocaine; Promethazine hcl; and Vicodin  Current Outpatient Prescriptions  Medication Sig Dispense Refill  . hyaluronate sodium (RADIAPLEXRX) GEL Apply 1 application topically 2 (two) times daily.      Marland Kitchen ibuprofen (ADVIL,MOTRIN) 400 MG tablet Take 400 mg by mouth every 6 (six) hours as needed for pain.      . non-metallic deodorant Thornton Papas) MISC Apply 1 application topically daily as needed.       No current facility-administered medications for this encounter.   Facility-Administered Medications Ordered in Other Encounters  Medication Dose Route Frequency Provider Last Rate Last Dose  . hyaluronate sodium (RADIAPLEXRX) gel   Topical Once Lurline Hare, MD      . non-metallic deodorant Thornton Papas) 1  application  1 application Topical Once Lurline Hare, MD       Labs:  Lab Results  Component Value Date   WBC 5.6 03/10/2013   HGB 13.0 03/10/2013   HCT 36.9 03/10/2013   MCV 91.4 03/10/2013   PLT 182 03/10/2013   Lab Results  Component Value Date   CREATININE 0.7 03/10/2013   BUN 8.3 03/10/2013   NA 144 03/10/2013   K 3.8 03/10/2013   CL 102 10/09/2007   CO2 27 03/10/2013   Lab Results  Component Value Date   ALT 9 03/10/2013   AST 13 03/10/2013   BILITOT 0.45 03/10/2013    Physical Examination:  temperature is 98.1 F (36.7 C). Her blood pressure is 123/75 and her pulse is 89. Her oxygen saturation is 100%.  the patient looks acutely ill compared to my exam earlier in the week she has obvious swelling along the lower face and upper neck region.  she has painful palpable adenopathy in the submental area. The oral cavity shows no secondary infection. The posterior pharynx is erythematous.  The location of the "boil "rupture is in the chin region.   Wt Readings from Last 3 Encounters:  05/05/13 138 lb 3.2 oz (62.687 kg)  02/13/12 137 lb (62.143 kg)  02/11/12 137  lb 4 oz (62.256 kg)     Lungs - Normal respiratory effort, chest expands symmetrically. Lungs are clear to auscultation, no crackles or wheezes.  Heart has regular rhythm and rate  Abdomen is soft and non tender with normal bowel sounds  Assessment:  Probable cellulitis involving the face and upper neck region  Plan: The patient will be placed on Septra DS and Keflex as she has had on previous skin infections. She understands to present to the emergency room of her symptoms worsen within the next 24 hours. I've also asked her to present tomorrow to the nurse's station for evaluation after her breast radiation treatment to see if she is responding to her antibiotic therapy.

## 2013-05-07 NOTE — Telephone Encounter (Signed)
Called [facility] in [place].  Spoke to the pharmacist and called in cephalexin (keflex) 500 mg capsule.  Take 2 capsules (1000 mg total) by mouth 2 times daily.  Dispense 48 tablets, 0 refills.   Also septra DS.  Take 2 tablets po BID.  Dispense 48 tablets, 0 refills.

## 2013-05-07 NOTE — Progress Notes (Signed)
Patient to nursing for assessment of not feeling well.Loose stools, nausea;Developed pimple on chin yesterday which she popped and now has crusting over right lower chin.Throat with visible redness and small bumps.

## 2013-05-08 ENCOUNTER — Ambulatory Visit
Admission: RE | Admit: 2013-05-08 | Discharge: 2013-05-08 | Disposition: A | Discharge status: Home / Self Care | Payer: No Typology Code available for payment source | Source: Ambulatory Visit | Attending: Radiation Oncology | Admitting: Radiation Oncology

## 2013-05-11 ENCOUNTER — Ambulatory Visit
Admission: RE | Admit: 2013-05-11 | Discharge: 2013-05-11 | Disposition: A | Discharge status: Home / Self Care | Payer: No Typology Code available for payment source | Source: Ambulatory Visit | Attending: Radiation Oncology | Admitting: Radiation Oncology

## 2013-05-11 ENCOUNTER — Other Ambulatory Visit: Payer: Self-pay | Admitting: Radiation Oncology

## 2013-05-11 DIAGNOSIS — C50911 Malignant neoplasm of unspecified site of right female breast: Secondary | ICD-10-CM

## 2013-05-11 LAB — COMPREHENSIVE METABOLIC PANEL (CC13)
AST: 14 U/L (ref 5–34)
Alkaline Phosphatase: 74 U/L (ref 40–150)
CO2: 28 mEq/L (ref 22–29)
Creatinine: 0.7 mg/dL (ref 0.6–1.1)
Glucose: 87 mg/dl (ref 70–140)
Potassium: 4 mEq/L (ref 3.5–5.1)
Sodium: 139 mEq/L (ref 136–145)
Total Bilirubin: 0.55 mg/dL (ref 0.20–1.20)
Total Protein: 7.4 g/dL (ref 6.4–8.3)

## 2013-05-11 LAB — CBC WITH DIFFERENTIAL/PLATELET
BASO%: 0.6 % (ref 0.0–2.0)
EOS%: 3.2 % (ref 0.0–7.0)
Eosinophils Absolute: 0.1 10*3/uL (ref 0.0–0.5)
HCT: 38.4 % (ref 34.8–46.6)
LYMPH%: 24.9 % (ref 14.0–49.7)
MCH: 30.5 pg (ref 25.1–34.0)
MCHC: 34.1 g/dL (ref 31.5–36.0)
MCV: 89.5 fL (ref 79.5–101.0)
MONO%: 7.6 % (ref 0.0–14.0)
NEUT%: 63.7 % (ref 38.4–76.8)
Platelets: 222 10*3/uL (ref 145–400)
RBC: 4.29 10*6/uL (ref 3.70–5.45)
RDW: 13.2 % (ref 11.2–14.5)

## 2013-05-11 MED ORDER — ONDANSETRON HCL 8 MG PO TABS: 8.0000 mg | ORAL_TABLET | Freq: Three times a day (TID) | ORAL | Status: DC | PRN

## 2013-05-11 NOTE — Progress Notes (Signed)
[facility]    Radiation Oncology [address] [place]     Maryln Gottron, M.D. [place], [place] [address]               Billie Lade, M.D., Ph.D. Phone: [phone]      Molli Hazard A. Kathrynn Running, M.D. Fax: [phone]      Radene Gunning, M.D., Ph.D.         Lurline Hare, M.D.         Grayland Jack, M.D Weekly Treatment Management Note  Name: Melissa Weiss     MRN: [id number]        CSN: [id number] Date: 05/11/2013      DOB: 08/13/1980  CC: [name], MD         [place]    Status: Outpatient  Diagnosis: The encounter diagnosis was Breast cancer, right.  Current Dose: 16.2 Gy  Current Fraction: 9   Planned Dose: 45+ Gy  Narrative: [name] was seen today for weekly treatment management. The chart was checked and port films  were reviewed.   The patient asked to be seen again today. She was seen last week and was felt to have recurrence of her cellulitis.  She was placed on Bactrim as well as Keflex which have worked well for her in the past.  Patient was actually unable to take her antibiotics over the weekend and remained in bed most of the weekend.  She complains of a lot of nausea this time. She was taking 4 mg of Zofran but has run out of this medication. I did refill Zofran 8 mg every 8 hours to see if this will work better for her. Patient is having less swelling in the face and is less tender in the submental area.  The patient and her husband are abstaining from intercourse at the present time so she's not pregnant.  Azithromycin; Codeine; Erythromycin; Ferrous sulfate; Lidocaine; Promethazine hcl; and Vicodin Current Outpatient Prescriptions  Medication Sig Dispense Refill  . cephALEXin (KEFLEX) 500 MG capsule Take 1,000 mg by mouth 2 (two) times daily. Called in to [facility] in [place] 05/07/13.  Dispense 48 capsules. 0 refills.      . hyaluronate sodium (RADIAPLEXRX) GEL Apply 1 application topically 2 (two) times daily.      Marland Kitchen ibuprofen  (ADVIL,MOTRIN) 400 MG tablet Take 400 mg by mouth every 6 (six) hours as needed for pain.      . non-metallic deodorant Thornton Papas) MISC Apply 1 application topically daily as needed.      . ondansetron (ZOFRAN) 8 MG tablet Take 1 tablet (8 mg total) by mouth every 8 (eight) hours as needed for nausea.  20 tablet  1  . sulfamethoxazole-trimethoprim (BACTRIM DS,SEPTRA DS) 800-160 MG per tablet Take 2 tablets by mouth 2 (two) times daily. Called in to [facility] in [place]. Dispense 48 tablets. 0 refills.       No current facility-administered medications for this encounter.   Facility-Administered Medications Ordered in Other Encounters  Medication Dose Route Frequency Provider Last Rate Last Dose  . hyaluronate sodium (RADIAPLEXRX) gel   Topical Once Lurline Hare, MD      . non-metallic deodorant Thornton Papas) 1 application  1 application Topical Once Lurline Hare, MD       Labs:  Lab Results  Component Value Date   WBC 5.6 03/10/2013   HGB 13.0 03/10/2013   HCT 36.9 03/10/2013   MCV 91.4 03/10/2013   PLT 182 03/10/2013  Lab Results  Component Value Date   CREATININE 0.7 03/10/2013   BUN 8.3 03/10/2013   NA 144 03/10/2013   K 3.8 03/10/2013   CL 102 10/09/2007   CO2 27 03/10/2013   Lab Results  Component Value Date   ALT 9 03/10/2013   AST 13 03/10/2013   BILITOT 0.45 03/10/2013    Physical Examination:  vitals were not taken for this visit.   Wt Readings from Last 3 Encounters:  05/05/13 138 lb 3.2 oz (62.687 kg)  02/13/12 137 lb (62.143 kg)  02/11/12 137 lb 4 oz (62.256 kg)    The swelling and erythema in the face is much better on exam today. The oral cavity is free of secondary infection or mucosal lesion or exudates.     Assessment:  Patient tolerating treatments well  Plan: The etiology of the patient's nausea is unknown at this time.  She may possibly have a viral infection in combination with her bacterial infection. This nausea possibly could be related to her antibiotic therapy but  she has taken these medications well in the past. She will start taking Zofran 8 mg as above. I've asked patient to restart her Keflex only if possible.  Patient will proceed to the lab for routine blood work today and will see Dr. Michell Heinrich again tomorrow.

## 2013-05-12 ENCOUNTER — Ambulatory Visit
Admission: RE | Admit: 2013-05-12 | Discharge: 2013-05-12 | Disposition: A | Discharge status: Home / Self Care | Payer: No Typology Code available for payment source | Source: Ambulatory Visit | Attending: Radiation Oncology | Admitting: Radiation Oncology

## 2013-05-12 VITALS — BP 108/73 | HR 87 | Temp 98.0°F | Wt 136.6 lb

## 2013-05-12 DIAGNOSIS — C50911 Malignant neoplasm of unspecified site of right female breast: Secondary | ICD-10-CM

## 2013-05-12 MED ORDER — PROMETHAZINE HCL 25 MG PO TABS: 25.0000 mg | ORAL_TABLET | Freq: Four times a day (QID) | ORAL | Status: DC | PRN

## 2013-05-12 NOTE — Progress Notes (Signed)
Weekly assessment of radiation to right breast.Completed 11 of 25 treatments.Skin is fine.Patient continues to have unresolved nausea despite taking zofran and increased fatigue.Has been feeling bad since Thursday. Started on keflex and bactrim which she has stopped bactrim but still feels washed out.Question if possible virus.Labwork all within normal range.

## 2013-05-12 NOTE — Progress Notes (Signed)
Weekly Management Note Current Dose:19.8 Gy  Projected Dose:45 Gy   Narrative:  The patient presents for routine under treatment assessment.  CBCT/MVCT images/Port film x-rays were reviewed.  The chart was checked. Nausea continues (zofran takes the edge off). Infection has responded well to Keflex alone. Not taking Bactrim. Has not heard back from fertility physician re: abdominal symptoms. Skin is slightly red.  Physical Findings:  Normal cervical, supraclavicular and submental lymph nodes. Dried/crusted area on right cheek.   Vitals:  Filed Vitals:   05/12/13 1228  BP: 108/73  Pulse: 87  Temp: 98 F (36.7 C)   Weight:  Wt Readings from Last 3 Encounters:  05/12/13 136 lb 9.6 oz (61.961 kg)  05/05/13 138 lb 3.2 oz (62.687 kg)  02/13/12 137 lb (62.143 kg)   Lab Results  Component Value Date   WBC 4.3 05/11/2013   HGB 13.1 05/11/2013   HCT 38.4 05/11/2013   MCV 89.5 05/11/2013   PLT 222 05/11/2013   Lab Results  Component Value Date   CREATININE 0.7 05/11/2013   BUN 8.1 05/11/2013   NA 139 05/11/2013   K 4.0 05/11/2013   CL 102 10/09/2007   CO2 28 05/11/2013     Impression:  The patient is tolerating radiation.  Plan:  Continue treatment as planned. [place] called in to her drug store pre her request. Continue radiaplex. Let us know if nausea worsens.

## 2013-05-13 ENCOUNTER — Ambulatory Visit
Admission: RE | Admit: 2013-05-13 | Discharge: 2013-05-13 | Disposition: A | Discharge status: Home / Self Care | Payer: No Typology Code available for payment source | Source: Ambulatory Visit | Attending: Radiation Oncology | Admitting: Radiation Oncology

## 2013-05-14 ENCOUNTER — Ambulatory Visit
Admission: RE | Admit: 2013-05-14 | Discharge: 2013-05-14 | Disposition: A | Discharge status: Home / Self Care | Payer: No Typology Code available for payment source | Source: Ambulatory Visit | Attending: Radiation Oncology | Admitting: Radiation Oncology

## 2013-05-15 ENCOUNTER — Ambulatory Visit
Admission: RE | Admit: 2013-05-15 | Discharge: 2013-05-15 | Disposition: A | Discharge status: Home / Self Care | Payer: No Typology Code available for payment source | Source: Ambulatory Visit | Attending: Radiation Oncology | Admitting: Radiation Oncology

## 2013-05-15 VITALS — BP 113/76 | HR 90 | Temp 98.3°F

## 2013-05-15 DIAGNOSIS — C50911 Malignant neoplasm of unspecified site of right female breast: Secondary | ICD-10-CM

## 2013-05-15 NOTE — Progress Notes (Signed)
Weekly Management Note Current Dose: 25.2  Gy  Projected Dose:  45 Gy   Narrative:  The patient presents for routine under treatment assessment.  CBCT/MVCT images/Port film x-rays were reviewed.  The chart was checked.C/o diarrhea x 2 days and constant abdominal pain.  Still nauseated. Phenergan helping some. No bleeding. Per fertility MD not symptoms of her menstrual cycle or fertitlity treatments. No sick contacts. Took a pregnancy test that was negative.  "I have to keep cheez-its with me so I don't puke" No breast related complaints.  Physical Findings: Weight:  . Unchanged. Some pain with palpation in the peigastric region. No rebound or guarding.   Impression:  The patient is tolerating radiation.  Plan:  Continue treatment as planned for breast cancer. I'm not sure what is going on with her abdomen.  Nothing actue as far as I can tell and she is not running a fever.  Her white count was normal 10/6. I encouraged her to contact her PCP today for evaluation as this has been going on for over a week now.

## 2013-05-15 NOTE — Progress Notes (Signed)
Patient has continued nausea despite zofran and phenergan.New let sided mild abdominal pain but doesn't require pain medication.Patient just wants assessment by doctor.

## 2013-05-18 ENCOUNTER — Ambulatory Visit
Admission: RE | Admit: 2013-05-18 | Discharge: 2013-05-18 | Disposition: A | Discharge status: Home / Self Care | Payer: No Typology Code available for payment source | Source: Ambulatory Visit | Attending: Radiation Oncology | Admitting: Radiation Oncology

## 2013-05-19 ENCOUNTER — Encounter: Payer: Self-pay | Admitting: Radiation Oncology

## 2013-05-19 ENCOUNTER — Ambulatory Visit
Admission: RE | Admit: 2013-05-19 | Discharge: 2013-05-19 | Disposition: A | Discharge status: Home / Self Care | Payer: No Typology Code available for payment source | Source: Ambulatory Visit | Attending: Radiation Oncology | Admitting: Radiation Oncology

## 2013-05-19 VITALS — BP 118/81 | HR 81 | Temp 97.9°F | Wt 138.5 lb

## 2013-05-19 DIAGNOSIS — C50911 Malignant neoplasm of unspecified site of right female breast: Secondary | ICD-10-CM

## 2013-05-19 NOTE — Progress Notes (Addendum)
Weekly Management Note Current Dose:  27 Gy  Projected Dose: 61 Gy   Narrative:  The patient presents for routine under treatment assessment.  CBCT/MVCT images/Port film x-rays were reviewed.  The chart was checked. Feeling much begtter. Stopped ovulating. No nausea. Working Community education officer. Continues on antibiotics.  Physical Findings: Weight: 138 lb 8 oz (62.823 kg). Skin is pink. No breakdown  Impression:  The patient is tolerating radiation.  Plan:  Continue treatment as planned. Continue radiaplex.

## 2013-05-19 NOTE — Progress Notes (Signed)
Name: Melissa Weiss   MRN: [id number]  Date:  05/19/2013   DOB: Apr 08, 1981  Status:outpatient    DIAGNOSIS: Breast cancer.  CONSENT VERIFIED: yes   SET UP: Patient is setup supine   IMMOBILIZATION:  The following immobilization was used:Custom Moldable Pillow, breast board.   NARRATIVE: [name] underwent complex simulation and treatment planning for her boost treatment today.  Her tumor volume was outlined on the planning CT scan. The depth of her cavity was  Cm.    12  MeV electrons will be prescribed to the 90% isodose line.   A block will be used for beam modification purposes.  A special port plan is requested.

## 2013-05-19 NOTE — Progress Notes (Signed)
Patient here for routine weekly assessment of radiation to right breast.No skin problems.Affect bright and bubbly today.Nausea resolved over the week-end.Continue with application of radiaplex as directed.

## 2013-05-20 ENCOUNTER — Ambulatory Visit
Admission: RE | Admit: 2013-05-20 | Discharge: 2013-05-20 | Disposition: A | Discharge status: Home / Self Care | Payer: No Typology Code available for payment source | Source: Ambulatory Visit | Attending: Radiation Oncology | Admitting: Radiation Oncology

## 2013-05-21 ENCOUNTER — Ambulatory Visit
Admission: RE | Admit: 2013-05-21 | Discharge: 2013-05-21 | Disposition: A | Discharge status: Home / Self Care | Payer: No Typology Code available for payment source | Source: Ambulatory Visit | Attending: Radiation Oncology | Admitting: Radiation Oncology

## 2013-05-22 ENCOUNTER — Ambulatory Visit
Admission: RE | Admit: 2013-05-22 | Discharge: 2013-05-22 | Disposition: A | Discharge status: Home / Self Care | Payer: No Typology Code available for payment source | Source: Ambulatory Visit | Attending: Radiation Oncology | Admitting: Radiation Oncology

## 2013-05-25 ENCOUNTER — Ambulatory Visit
Admission: RE | Admit: 2013-05-25 | Discharge: 2013-05-25 | Disposition: A | Discharge status: Home / Self Care | Payer: No Typology Code available for payment source | Source: Ambulatory Visit | Attending: Radiation Oncology | Admitting: Radiation Oncology

## 2013-05-26 ENCOUNTER — Ambulatory Visit
Admission: RE | Admit: 2013-05-26 | Discharge: 2013-05-26 | Disposition: A | Discharge status: Home / Self Care | Payer: No Typology Code available for payment source | Source: Ambulatory Visit | Attending: Radiation Oncology | Admitting: Radiation Oncology

## 2013-05-26 DIAGNOSIS — C50911 Malignant neoplasm of unspecified site of right female breast: Secondary | ICD-10-CM

## 2013-05-26 NOTE — Progress Notes (Signed)
Weekly Management Note Current Dose: 37.8  Gy  Projected Dose: 61 Gy   Narrative:  The patient presents for routine under treatment assessment.  CBCT/MVCT images/Port film x-rays were reviewed.  The chart was checked. Doing well. Being treated for UTI. No nausea. Minimal skin irritation.   Physical Findings: Weight:  . Unchanged. Seen on treatment machine. Slightly pink right breast skin.   Impression:  The patient is tolerating radiation.  Plan:  Continue treatment as planned. Continue radiaplex. UTI tx per urgent care.

## 2013-05-27 ENCOUNTER — Ambulatory Visit
Admission: RE | Admit: 2013-05-27 | Discharge: 2013-05-27 | Disposition: A | Discharge status: Home / Self Care | Payer: No Typology Code available for payment source | Source: Ambulatory Visit | Attending: Radiation Oncology | Admitting: Radiation Oncology

## 2013-05-28 ENCOUNTER — Ambulatory Visit
Admission: RE | Admit: 2013-05-28 | Discharge: 2013-05-28 | Disposition: A | Discharge status: Home / Self Care | Payer: No Typology Code available for payment source | Source: Ambulatory Visit | Attending: Radiation Oncology | Admitting: Radiation Oncology

## 2013-05-28 ENCOUNTER — Encounter: Payer: Self-pay | Admitting: Radiation Oncology

## 2013-05-28 VITALS — BP 117/75 | HR 108 | Temp 98.5°F

## 2013-05-28 DIAGNOSIS — C50911 Malignant neoplasm of unspecified site of right female breast: Secondary | ICD-10-CM

## 2013-05-28 NOTE — Progress Notes (Signed)
Weekly Management Note Current Dose:  41.4 Gy  Projected Dose: 61 Gy   Narrative:  The patient presents for routine under treatment assessment.  CBCT/MVCT images/Port film x-rays were reviewed.  The chart was checked. Saw urology and had a CT without contrast to check for stones. 3mm lung nodule was seen and was likely incidental. Urology requested follow up ewith oncology.  Patient is in tears and very upset (mother died of breast cancer with mets to her lungs). Would like imaging ASAP.  Had CT at [facility] 2.5 years ago which she was told was normal. No hemoptysis. No dyspnea. No abdominal symptoms  Physical Findings: Weight:  . Tearful. Anxious.   Impression:  The patient is tolerating radiation.  Plan:  Continue treatment as planned. CT chest to eval lung nodules. Given recent abdominal symptoms, check CT of abdomen and pelvis. (neg pregnancy test 05/15/13)

## 2013-05-29 ENCOUNTER — Other Ambulatory Visit: Payer: Self-pay | Admitting: Radiation Oncology

## 2013-05-29 ENCOUNTER — Ambulatory Visit
Admission: RE | Admit: 2013-05-29 | Discharge: 2013-05-29 | Disposition: A | Discharge status: Home / Self Care | Payer: No Typology Code available for payment source | Source: Ambulatory Visit | Attending: Radiation Oncology | Admitting: Radiation Oncology

## 2013-05-29 ENCOUNTER — Ambulatory Visit (HOSPITAL_COMMUNITY)
Admission: RE | Admit: 2013-05-29 | Discharge: 2013-05-29 | Disposition: A | Discharge status: Home / Self Care | Payer: No Typology Code available for payment source | Source: Ambulatory Visit | Attending: Radiation Oncology | Admitting: Radiation Oncology

## 2013-05-29 DIAGNOSIS — C50919 Malignant neoplasm of unspecified site of unspecified female breast: Secondary | ICD-10-CM | POA: Insufficient documentation

## 2013-05-29 DIAGNOSIS — R911 Solitary pulmonary nodule: Secondary | ICD-10-CM | POA: Insufficient documentation

## 2013-05-29 DIAGNOSIS — C50911 Malignant neoplasm of unspecified site of right female breast: Secondary | ICD-10-CM

## 2013-05-29 MED ORDER — IOHEXOL 300 MG/ML  SOLN
50.0000 mL | Freq: Once | INTRAMUSCULAR | Status: AC | PRN
  Administered 2013-05-29: 50 mL via ORAL

## 2013-05-29 MED ORDER — IOHEXOL 300 MG/ML  SOLN
100.0000 mL | Freq: Once | INTRAMUSCULAR | Status: AC | PRN
  Administered 2013-05-29: 100 mL via INTRAVENOUS

## 2013-06-01 ENCOUNTER — Ambulatory Visit
Admission: RE | Admit: 2013-06-01 | Discharge: 2013-06-01 | Disposition: A | Discharge status: Home / Self Care | Payer: No Typology Code available for payment source | Source: Ambulatory Visit | Attending: Radiation Oncology | Admitting: Radiation Oncology

## 2013-06-02 ENCOUNTER — Ambulatory Visit: Payer: No Typology Code available for payment source | Admitting: Oncology

## 2013-06-02 ENCOUNTER — Ambulatory Visit
Admission: RE | Admit: 2013-06-02 | Discharge: 2013-06-02 | Disposition: A | Discharge status: Home / Self Care | Payer: No Typology Code available for payment source | Source: Ambulatory Visit | Attending: Radiation Oncology | Admitting: Radiation Oncology

## 2013-06-02 VITALS — BP 112/70 | HR 103 | Temp 98.2°F | Wt 135.8 lb

## 2013-06-02 DIAGNOSIS — C50911 Malignant neoplasm of unspecified site of right female breast: Secondary | ICD-10-CM

## 2013-06-02 NOTE — Progress Notes (Addendum)
Patient here for routine weekly assessment.Mild hyperpigmentation with follicular rash and mild itching.Skin has done very well considering not really using radiaplex.Started boost 1 of 8 today.Denies fatigue.

## 2013-06-02 NOTE — Progress Notes (Signed)
Weekly Management Note Current Dose: 47 Gy  Projected Dose: 61 Gy   Narrative:  The patient presents for routine under treatment assessment.  CBCT/MVCT images/Port film x-rays were reviewed.  The chart was checked. More questions about her CT scan report. Nurse called as a friend from medical oncology and told patient she should "demand" a pet scan to evaluate lung nodule and liver cyst.  She is emotionally distraught. No breast related complaints.  Physical Findings: Weight: 135 lb 12.8 oz (61.598 kg). Skin is pink. No desquamation.   Impression:  The patient is tolerating radiation.  Plan:  Continue treatment as planned. Discussed her CT findings again. Discussed the low sensitivity for PET in lesions under 1 cm.  We went over hamartoma and benign pulmonary nodules.  I have recommended a follow up scan and that she desist from basing her experience on those of others although that is difficult.  She is struggling with her mother's diagnosis and death from metastatic disease and trying to be proactive.

## 2013-06-03 ENCOUNTER — Ambulatory Visit
Admission: RE | Admit: 2013-06-03 | Discharge: 2013-06-03 | Disposition: A | Discharge status: Home / Self Care | Payer: No Typology Code available for payment source | Source: Ambulatory Visit | Attending: Radiation Oncology | Admitting: Radiation Oncology

## 2013-06-04 ENCOUNTER — Ambulatory Visit
Admission: RE | Admit: 2013-06-04 | Discharge: 2013-06-04 | Disposition: A | Discharge status: Home / Self Care | Payer: No Typology Code available for payment source | Source: Ambulatory Visit | Attending: Radiation Oncology | Admitting: Radiation Oncology

## 2013-06-05 ENCOUNTER — Ambulatory Visit
Admission: RE | Admit: 2013-06-05 | Discharge: 2013-06-05 | Disposition: A | Discharge status: Home / Self Care | Payer: No Typology Code available for payment source | Source: Ambulatory Visit | Attending: Radiation Oncology | Admitting: Radiation Oncology

## 2013-06-08 ENCOUNTER — Ambulatory Visit
Admission: RE | Admit: 2013-06-08 | Discharge: 2013-06-08 | Disposition: A | Discharge status: Home / Self Care | Payer: No Typology Code available for payment source | Source: Ambulatory Visit | Attending: Radiation Oncology | Admitting: Radiation Oncology

## 2013-06-09 ENCOUNTER — Ambulatory Visit
Admission: RE | Admit: 2013-06-09 | Discharge: 2013-06-09 | Disposition: A | Discharge status: Home / Self Care | Payer: No Typology Code available for payment source | Source: Ambulatory Visit | Attending: Radiation Oncology | Admitting: Radiation Oncology

## 2013-06-09 VITALS — BP 119/81 | HR 98 | Temp 98.5°F | Wt 136.9 lb

## 2013-06-09 DIAGNOSIS — C50911 Malignant neoplasm of unspecified site of right female breast: Secondary | ICD-10-CM

## 2013-06-09 NOTE — Progress Notes (Signed)
Weekly Management Note Current Dose: 57  Gy  Projected Dose: 61 Gy   Narrative:  The patient presents for routine under treatment assessment.  CBCT/MVCT images/Port film x-rays were reviewed.  The chart was checked. Doing well. No complaints. Skin red and irritated. Discussed MRI of liver and CT chest with Dr. Welton Flakes.  Debating on pregnancy issues.   Physical Findings: Weight: 136 lb 14.4 oz (62.097 kg). Unchanged  Impression:  The patient is tolerating radiation.  Plan:  Continue treatment as planned.Continue RT. Follow up in 1 month. Skin care discussed.

## 2013-06-09 NOTE — Progress Notes (Signed)
Patient here for weekly assessment of radiation to right breast.Mild discoloration and dry peel around right areola.Patient has completed 31 of 33 treatments.Knows to continue application of radiaplex and may try lotion with vitamin e.In better spirits today.Explained process of follow up per her medical team.

## 2013-06-10 ENCOUNTER — Ambulatory Visit
Admission: RE | Admit: 2013-06-10 | Discharge: 2013-06-10 | Disposition: A | Discharge status: Home / Self Care | Payer: No Typology Code available for payment source | Source: Ambulatory Visit | Attending: Radiation Oncology | Admitting: Radiation Oncology

## 2013-06-11 ENCOUNTER — Ambulatory Visit
Admission: RE | Admit: 2013-06-11 | Discharge: 2013-06-11 | Disposition: A | Discharge status: Home / Self Care | Payer: No Typology Code available for payment source | Source: Ambulatory Visit | Attending: Radiation Oncology | Admitting: Radiation Oncology

## 2013-06-11 ENCOUNTER — Other Ambulatory Visit: Payer: Self-pay

## 2013-06-11 ENCOUNTER — Encounter: Payer: Self-pay | Admitting: Radiation Oncology

## 2013-06-11 VITALS — BP 115/69 | HR 96 | Temp 98.5°F | Wt 138.4 lb

## 2013-06-11 DIAGNOSIS — C50911 Malignant neoplasm of unspecified site of right female breast: Secondary | ICD-10-CM

## 2013-06-11 NOTE — Progress Notes (Signed)
Melissa Weiss finishes radiation today. She had concerns about the medial scar not being treated in her boost. 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 We went over this in great detail. I encouraged her to followup with Dr. Welton Flakes regarding antiestrogen therapy. I will see her back in a month. He has skin irritation in the axilla. Otherwise her skin is intact and slightly pink.

## 2013-06-16 NOTE — Progress Notes (Signed)
  Radiation Oncology         [phone] ________________________________  Name: Melissa Weiss MRN: [id number]  Date: 06/11/2013  DOB: [birth year]  End of Treatment Note  Diagnosis:   T2N0 Right breast cancer     Indication for treatment:  Curative       Radiation treatment dates:   04/28/2013-06/11/2013  Site/dose:    Right breast / 45 Gray @ 1.8 Wallace Cullens per fraction x 25 fractions Right breast boost / 16 Gray at [employer] per fraction x 8 fractions  Beams/energy:  Opposed Tangents / 6 MV photons En face / 12 MeV electrons  Narrative: The patient tolerated radiation treatment relatively well.   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 She struggled with unexplained nausea and abdominal pain in the second week of her treatment which resolved. She also had a CT scan which showed a 3 mm lung nodule which led to a follow up chest CT which showed the same nodule which was likely benign and too small to characterize. In addition a liver cyst was noted. She insisted on follow up imaging of both of these areas which Dr. Welton Flakes has arranged. She struggled with high levels of anxiety regarding her prognosis and fears of metastatic disease. Social work and Agricultural consultant support was offered but she declined.  She was uncertain as to whether she would puruse antiestrogen therapy due to fears of side effects despite our strong recommendation to do so. Her dermatitis of the right breast was minima.   Plan: The patient has completed radiation treatment. The patient will return to radiation oncology clinic for routine followup in one month. I advised her to call or return sooner if she have any questions or concerns related to her recovery or treatment. She has follow up scheduled with Dr. Welton Flakes.   ------------------------------------------------  Melissa Hare, MD

## 2013-06-19 ENCOUNTER — Encounter: Payer: Self-pay | Admitting: *Deleted

## 2013-06-19 NOTE — Progress Notes (Signed)
Mailed after appt letter to pt. 

## 2013-07-09 ENCOUNTER — Other Ambulatory Visit: Payer: Self-pay | Admitting: Oncology

## 2013-07-09 ENCOUNTER — Encounter: Payer: Self-pay | Admitting: Radiation Oncology

## 2013-07-10 ENCOUNTER — Ambulatory Visit
Admission: RE | Admit: 2013-07-10 | Payer: No Typology Code available for payment source | Source: Ambulatory Visit | Admitting: Radiation Oncology

## 2013-07-10 ENCOUNTER — Telehealth: Payer: Self-pay | Admitting: Oncology

## 2013-07-10 ENCOUNTER — Telehealth: Payer: Self-pay | Admitting: *Deleted

## 2013-07-10 HISTORY — DX: Personal history of irradiation: Z92.3

## 2013-07-10 NOTE — Telephone Encounter (Signed)
Called patient only phone number,left voice message asking if she was running late for her 1:20pm follow up appt today, or if she knew of appt, if missed please call to reschedule, 1:37 PM

## 2013-07-14 ENCOUNTER — Telehealth: Payer: Self-pay | Admitting: Oncology

## 2013-07-14 NOTE — Telephone Encounter (Signed)
I can see her on 1/14 at 4:00

## 2013-07-14 NOTE — Telephone Encounter (Signed)
, °

## 2013-07-16 ENCOUNTER — Ambulatory Visit: Payer: No Typology Code available for payment source | Admitting: Oncology

## 2013-08-10 ENCOUNTER — Telehealth: Payer: Self-pay | Admitting: *Deleted

## 2013-08-10 NOTE — Telephone Encounter (Signed)
CALLED PATIENT TO ASK ABOUT RESCHEDULING MISSED FU VISIT ON 07-10-13, SPOKE WITH PATIENT AND SHE WILL BE HERE ON 08-28-13.

## 2013-08-28 ENCOUNTER — Ambulatory Visit: Payer: No Typology Code available for payment source | Admitting: Radiation Oncology

## 2013-09-03 ENCOUNTER — Telehealth: Payer: Self-pay | Admitting: *Deleted

## 2013-09-03 ENCOUNTER — Other Ambulatory Visit: Payer: Self-pay | Admitting: Emergency Medicine

## 2013-09-03 ENCOUNTER — Ambulatory Visit
Admission: RE | Admit: 2013-09-03 | Discharge: 2013-09-03 | Disposition: A | Discharge status: Home / Self Care | Payer: No Typology Code available for payment source | Source: Ambulatory Visit | Attending: Radiation Oncology | Admitting: Radiation Oncology

## 2013-09-03 ENCOUNTER — Encounter: Payer: Self-pay | Admitting: Radiation Oncology

## 2013-09-03 VITALS — BP 117/76 | HR 84 | Temp 98.6°F | Ht 65.0 in | Wt 134.1 lb

## 2013-09-03 DIAGNOSIS — C50919 Malignant neoplasm of unspecified site of unspecified female breast: Secondary | ICD-10-CM

## 2013-09-03 MED ORDER — TAMOXIFEN CITRATE 20 MG PO TABS: 20.0000 mg | ORAL_TABLET | Freq: Every day | ORAL | Status: DC

## 2013-09-03 NOTE — Progress Notes (Signed)
Melissa Weiss here for fu s/p radiation therapy to her right breast wthich completed 11/6//14.  She denies any fatigue nor pain, but reports lingering tanning in the right axilla.

## 2013-09-03 NOTE — Telephone Encounter (Signed)
Called and left message on RN phone [phone], For Dr.Khan to wite rx of Tamoxifen for MS.[place]ark per Dr. Pablo Ledger, if any questions pleas call Rad/Onc [phone] and speak with Dr.Wentworth's RN Patric Dykes, thank you 10:44 AM

## 2013-09-03 NOTE — Progress Notes (Addendum)
   Department of Radiation Oncology  Phone:  [phone] Fax:        [phone]   Name: MARTAVIA TYE MRN: [id number]  DOB: [birth year]  Date: 09/03/2013  Follow Up Visit Note  Diagnosis: T2N0 Right breast cancer  Summary and Interval since last radiation: 2 months from 61 Gy completed 06/11/13  Interval History: Emorie presents today for routine followup.  She admits that she's been avoiding followup with [place] because she does not want to take tamoxifen. She is scared about side effects. She is pleased with how her skin is healed up. She got to Mager tissue underneath the right arm but otherwise has done well. She is considering becoming pregnant again. She is due for her mammograms in may. She was concerned about some abnormalities she felt in the left breast and asking examine this today.  Allergies:  Allergies  Allergen Reactions  . Azithromycin     REACTION: nausea  . Codeine Nausea Only    REACTION: reaction not known  . Erythromycin     REACTION: nausea  . Ferrous Sulfate     REACTION: nausea  . Lidocaine     REACTION: swelling in mouth  . Promethazine Hcl     REACTION: redness and swelling at site if injected  . Vicodin [Hydrocodone-Acetaminophen] Nausea And Vomiting    Medications:  Current Outpatient Prescriptions  Medication Sig Dispense Refill  . tamoxifen (NOLVADEX) 20 MG tablet Take 1 tablet (20 mg total) by mouth daily.  30 tablet  12   No current facility-administered medications for this encounter.   Facility-Administered Medications Ordered in Other Encounters  Medication Dose Route Frequency Provider Last Rate Last Dose  . hyaluronate sodium (RADIAPLEXRX) gel   Topical Once Thea Silversmith, MD      . non-metallic deodorant Jethro Poling) 1 application  1 application Topical Once Thea Silversmith, MD        Physical Exam:  Filed Vitals:   09/03/13 1027  BP: 117/76  Pulse: 84  Temp: 98.6 F (37 C)  Height: 5\' 5"  (1.651 m)  Weight: 134 lb 1.6 oz (60.827  kg)   she is an excellent cosmetic result. She really doesn't have much in terms of bony loss. Should her skin is well-healed. An area of concern in the retroareolar aspect of the left breast edge is palpate normal breast tissue. Am not able to palpate a definitive mass. He feels very similar to what I feel behind her right nipple. No palpable axillary adenopathy she is alert minus x3.  IMPRESSION: Melissa Weiss is a 33 y.o. female status post breast conservation for a right breast cancer with resolving acute effects of treatment  PLAN:  I again talked Gissella about her risk of recurrence. We talked about her low of the type score is with tamoxifen. We don't have a score of what her survival would be long-term without taking tamoxifen. I discussed with her the risk of breast cancer in the contralateral breast increases with time. We discussed the role of tamoxifen in decreasing systemic failure. We talked about the difference treatment tamoxifen and aromatase inhibitors. I encouraged her to just at least try the tamoxifen to see if she had any side effects. I actually have my nurse walk her up to medical oncology to receive her prescription. I will see her back in 3-4 months with her mammogram. I encouraged her to contact me if any concerns in the interim.    Thea Silversmith, MD

## 2013-11-02 ENCOUNTER — Telehealth: Payer: Self-pay

## 2013-11-02 NOTE — Telephone Encounter (Signed)
Ok thanks 

## 2013-11-02 NOTE — Telephone Encounter (Signed)
Pt left msg needing to cancel appt with Dr. Humphrey Rolls on 4/1 at 10 am and reschedule.  POF sent.  Routed to [facility].    Advised pt she should be hearing from scheduling in next few days.  Pt voiced understanding.

## 2013-11-03 ENCOUNTER — Telehealth: Payer: Self-pay | Admitting: Oncology

## 2013-11-03 NOTE — Telephone Encounter (Signed)
, °

## 2013-11-04 ENCOUNTER — Ambulatory Visit: Payer: No Typology Code available for payment source | Admitting: Oncology

## 2013-12-31 ENCOUNTER — Ambulatory Visit: Payer: Self-pay | Admitting: Radiation Oncology

## 2013-12-31 ENCOUNTER — Telehealth: Payer: Self-pay | Admitting: *Deleted

## 2013-12-31 NOTE — Telephone Encounter (Signed)
CALLED PATIENT TO ASK ABOUT MAMMOGRAM, LVM FOR A RETURN CALL

## 2014-01-01 ENCOUNTER — Ambulatory Visit: Payer: No Typology Code available for payment source | Admitting: Radiation Oncology

## 2014-01-25 ENCOUNTER — Telehealth: Payer: Self-pay | Admitting: Oncology

## 2014-02-03 ENCOUNTER — Ambulatory Visit: Payer: No Typology Code available for payment source | Admitting: Oncology

## 2014-02-09 ENCOUNTER — Other Ambulatory Visit: Payer: Self-pay

## 2014-02-09 ENCOUNTER — Ambulatory Visit: Payer: No Typology Code available for payment source | Admitting: Oncology

## 2014-02-09 NOTE — Progress Notes (Signed)
Patient called to cancel 315 appt with Mikey Bussing.  Pt is having annual MRI at [facility] in next few weeks.  She will have results sent to clinic and call to reschedule appt.  POF sent to cancel, in basket msg sent to [facility].

## 2014-03-19 ENCOUNTER — Telehealth: Payer: Self-pay | Admitting: *Deleted

## 2014-03-19 NOTE — Telephone Encounter (Signed)
Called patient to ask question, lvm for a return call 

## 2014-03-24 ENCOUNTER — Telehealth: Payer: Self-pay

## 2014-03-24 NOTE — Telephone Encounter (Signed)
Left message for patient to forward mri of breast to me or bring report on follow up for Friday 03/26/14.

## 2014-03-26 ENCOUNTER — Ambulatory Visit
Admission: RE | Admit: 2014-03-26 | Payer: No Typology Code available for payment source | Source: Ambulatory Visit | Admitting: Radiation Oncology

## 2014-04-08 ENCOUNTER — Telehealth: Payer: Self-pay | Admitting: Hematology and Oncology

## 2014-04-08 NOTE — Telephone Encounter (Signed)
, °

## 2014-04-26 ENCOUNTER — Encounter: Payer: Self-pay | Admitting: Hematology and Oncology

## 2014-04-26 ENCOUNTER — Ambulatory Visit (HOSPITAL_BASED_OUTPATIENT_CLINIC_OR_DEPARTMENT_OTHER): Payer: No Typology Code available for payment source | Admitting: Hematology and Oncology

## 2014-04-26 VITALS — BP 105/67 | HR 74 | Temp 97.5°F | Resp 18 | Ht 65.0 in | Wt 132.9 lb

## 2014-04-26 DIAGNOSIS — D1739 Benign lipomatous neoplasm of skin and subcutaneous tissue of other sites: Secondary | ICD-10-CM

## 2014-04-26 DIAGNOSIS — Z170 Estrogen receptor positive status [ER+]: Secondary | ICD-10-CM

## 2014-04-26 DIAGNOSIS — C50411 Malignant neoplasm of upper-outer quadrant of right female breast: Secondary | ICD-10-CM

## 2014-04-26 DIAGNOSIS — C50419 Malignant neoplasm of upper-outer quadrant of unspecified female breast: Secondary | ICD-10-CM

## 2014-04-26 DIAGNOSIS — D179 Benign lipomatous neoplasm, unspecified: Secondary | ICD-10-CM

## 2014-04-26 DIAGNOSIS — R911 Solitary pulmonary nodule: Secondary | ICD-10-CM | POA: Insufficient documentation

## 2014-04-26 DIAGNOSIS — D492 Neoplasm of unspecified behavior of bone, soft tissue, and skin: Secondary | ICD-10-CM

## 2014-04-26 NOTE — Assessment & Plan Note (Signed)
Patient pointed to a soft tissue nodule on the tight which to me felt like a lipoma or a small sebaceous cyst. This is something that we can watch and monitor.

## 2014-04-26 NOTE — Progress Notes (Signed)
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   01/06/2013 Breast MRI 2 enhancing masses right breast upper-outer quadrant 0.9 cm and 1.4 cm together 2.8 cm plus non-mass enhancement : Left breast scattered foci of enhancement 5 mm in size   01/15/2013 Surgery Invasive adenocarcinoma 2.9 cm margins positive, reexcision 02/23/2013 microscopic ADH likely representing "trace residual DCIS"ER/PR positive HER-2 negative, SLN negative, Oncotype DX low risk   04/26/2013 Procedure Fertility preservation   04/27/2013 - 05/26/2013 Radiation Therapy Radiation therapy to lumpectomy site    CHIEF COMPLIANT: Patient here to followup regarding multiple issues including small nodules on the high left arm and a right axillary nodule  INTERVAL HISTORY: Melissa Weiss is a 33 year old Caucasian lady 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 It appears that from the beginning the mammograms have always been normal but upon MRI done in June 2014 she had enhancing masses in the right breast that from surgery, to be invasive adenocarcinomas it was ER/PR positive HER-2 negative. Because she had low risk Oncotype DX, she did not require adjuvant chemotherapy. She had subsequently done and radiation therapy to the right breast in the lumpectomy site. She follows with [facility] for surgical needs. Recently she had abnormalities in the breast based on  bilateral MRIs. And she underwent about 17 different biopsies. Pathology on those were benign.  Patient comes in today pointing to a new nodule in the right anterior axillary 4. She does not remember if this was there on the August MRI scan. She also had an abnormal CT scan a year ago showing a small lung nodule and a liver lesion. At that time she was recommended to have annual followup. Patient had tried fertility preservation. But all of her headaches had pushed. She did not stop tamoxifen therapy because she felt that the risk of relapse based on Oncotype DX was low and hence she did not want to take it.   REVIEW OF SYSTEMS:   Constitutional: Denies fevers, chills or abnormal weight loss Eyes: Denies blurriness of vision Ears, nose, mouth, throat, and face: Denies mucositis or sore throat Respiratory: Denies cough, dyspnea or wheezes Cardiovascular: Denies palpitation, chest discomfort or lower extremity swelling Gastrointestinal:  Denies nausea, heartburn or change in bowel habits Skin: Denies abnormal skin rashes, left arm tiny nodules subcutaneous fat, right thigh 1 cm subcutaneous lesion probably lipoma versus sebaceous cyst Lymphatics: Denies new lymphadenopathy or easy bruising Neurological:Denies numbness, tingling or new weaknesses Behavioral/Psych: Mood is stable, no new changes  Breast: Palpable lump in the right anterior axillary 4 in the superior right upper quadrant of right breast. Both breasts feel nodular and lumpy. All other systems were reviewed with the patient and are negative.  I have reviewed the past medical history, past surgical history, social history and family history with the patient and they are unchanged from previous note.  ALLERGIES:  is allergic to azithromycin; codeine; erythromycin;  ferrous sulfate; lidocaine; promethazine hcl; and vicodin.  MEDICATIONS:  Current Outpatient Prescriptions  Medication Sig Dispense Refill  . tamoxifen (NOLVADEX) 20 MG  tablet Take 1 tablet (20 mg total) by mouth daily.  30 tablet  12   No current facility-administered medications for this visit.   Facility-Administered Medications Ordered in Other Visits  Medication Dose Route Frequency Provider Last Rate Last Dose  . hyaluronate sodium (RADIAPLEXRX) gel   Topical Once Thea Silversmith, MD      . non-metallic deodorant Jethro Poling) 1 application  1 application Topical Once Thea Silversmith, MD        PHYSICAL EXAMINATION: ECOG PERFORMANCE STATUS: 0 - Asymptomatic  Filed Vitals:   04/26/14 1145  BP: 105/67  Pulse: 74  Temp: 97.5 F (36.4 C)  Resp: 18   Filed Weights   04/26/14 1145  Weight: 132 lb 14.4 oz (60.283 kg)    GENERAL:alert, no distress and comfortable SKIN: skin color, texture, turgor are normal, no rashes or significant lesions EYES: normal, Conjunctiva are pink and non-injected, sclera clear OROPHARYNX:no exudate, no erythema and lips, buccal mucosa, and tongue normal  NECK: supple, thyroid normal size, non-tender, without nodularity LYMPH:  no palpable lymphadenopathy in the cervical, axillary or inguinal LUNGS: clear to auscultation and percussion with normal breathing effort HEART: regular rate & rhythm and no murmurs and no lower extremity edema ABDOMEN:abdomen soft, non-tender and normal bowel sounds Musculoskeletal:no cyanosis of digits and no clubbing  NEURO: alert & oriented x 3 with fluent speech, no focal motor/sensory deficits BREAST right anterior axillary fold nodule measuring 1 cm is palpated   LABORATORY DATA:  I have reviewed the data as listed   Chemistry      Component Value Date/Time   NA 139 05/11/2013 1459   NA 135 10/09/2007 1534   K 4.0 05/11/2013 1459   K 3.3* 10/09/2007 1534   CL 102 10/09/2007 1534   CO2 28 05/11/2013 1459   CO2 25 10/09/2007 1534   BUN 8.1 05/11/2013 1459   BUN 3* 10/09/2007 1534   CREATININE 0.7 05/11/2013 1459   CREATININE 0.48 10/09/2007 1534      Component Value Date/Time   CALCIUM 9.4  05/11/2013 1459   CALCIUM 8.4 10/09/2007 1534   ALKPHOS 74 05/11/2013 1459   ALKPHOS 133* 10/09/2007 1534   AST 14 05/11/2013 1459   AST 22 10/09/2007 1534   ALT 8 05/11/2013 1459   ALT 17 10/09/2007 1534   BILITOT 0.55 05/11/2013 1459   BILITOT 1.0 10/09/2007 1534       Lab Results  Component Value Date   WBC 4.3 05/11/2013   HGB 13.1 05/11/2013   HCT 38.4 05/11/2013   MCV 89.5 05/11/2013   PLT 222 05/11/2013   NEUTROABS 2.7 05/11/2013     RADIOGRAPHIC STUDIES: I have personally reviewed the radiology reports and agreed with their findings. No results found.   ASSESSMENT & PLAN:  Breast cancer of upper-outer quadrant of right female breast Right breast adenocarcinoma T2, N0, M0 stage II A. ER/PR positive HER-2 negative status post lumpectomy and radiation currently on hormonal therapy with tamoxifen  Patient had recent bilateral breast MRIs at [facility] which revealed multiple 5 mm nodularities in both her breasts and she underwent 17 biopsies on the left breast which were apparently benign. Since the time of the MRI, patient had a new lump in the right anterior axillary fold which has not been biopsied yet. I felt this nodule and believe that these be biopsied under  ultrasound. Since the patient has had all of her biopsies and radiology done at [facility], I will try to arrange for her to undergo his biopsy at [facility] as well. She wishes to see her radiologist and surgeon at [facility].  LIPOMA Patient pointed to a soft tissue nodule on the tight which to me felt like a lipoma or a small sebaceous cyst. This is something that we can watch and monitor.  Lung nodule Lung nodule that was noted a year ago and a repeat CT scan. We will reevaluate this by another scan to be done this week. I will see her back on Thursday to go over the scan. Patient also had a small liver lesion which appeared to be benign on the initial scan but this would also need to be reevaluated with a CT of her abdomen and pelvis.   Orders  Placed This Encounter  Procedures  . CT Chest W Contrast    Standing Status: Future     Number of Occurrences:      Standing Expiration Date: 06/27/2015    Order Specific Question:  Reason for Exam (SYMPTOM  OR DIAGNOSIS REQUIRED)    Answer:  evaluate lung and liver nodule    Order Specific Question:  Is the patient pregnant?    Answer:  No    Order Specific Question:  Preferred imaging location?    Answer:  [facility]    Order Specific Question:  Call Report- Best Contact Number?    Answer:  [phone]  . CT Abdomen Pelvis W Contrast    Standing Status: Future     Number of Occurrences:      Standing Expiration Date: 04/26/2015    Order Specific Question:  Reason for Exam (SYMPTOM  OR DIAGNOSIS REQUIRED)    Answer:  evaluate lung and liver nodule    Order Specific Question:  Is the patient pregnant?    Answer:  No    Order Specific Question:  Preferred imaging location?    Answer:  [facility]    Order Specific Question:  Call Report- Best Contact Number?    Answer:  [phone]   The patient has a good understanding of the overall plan. she agrees with it. She will call with any problems that may develop before her next visit here.  I spent 30 minutes counseling the patient face to face. The total time spent in the appointment was 40 minutes and more than 50% was on counseling and review of test results    Melissa Eisenmenger, MD 04/26/2014 1:26 PM

## 2014-04-26 NOTE — Assessment & Plan Note (Signed)
Right breast adenocarcinoma T2, N0, M0 stage II A. ER/PR positive HER-2 negative status post lumpectomy and radiation currently on hormonal therapy with tamoxifen  Patient had recent bilateral breast MRIs at [facility] which revealed multiple 5 mm nodularities in both her breasts and she underwent 17 biopsies on the left breast which were apparently benign. Since the time of the MRI, patient had a new lump in the right anterior axillary fold which has not been biopsied yet. I felt this nodule and believe that these be biopsied under ultrasound. Since the patient has had all of her biopsies and radiology done at [facility], I will try to arrange for her to undergo his biopsy at [facility] as well. She wishes to see her radiologist and surgeon at [facility].

## 2014-04-26 NOTE — Assessment & Plan Note (Addendum)
Lung nodule that was noted a year ago and a repeat CT scan. We will reevaluate this by another scan to be done this week. I will see her back on Thursday to go over the scan. Patient also had a small liver lesion which appeared to be benign on the initial scan but this would also need to be reevaluated with a CT of her abdomen and pelvis.

## 2014-04-27 ENCOUNTER — Ambulatory Visit (HOSPITAL_COMMUNITY)
Admission: RE | Admit: 2014-04-27 | Discharge: 2014-04-27 | Disposition: A | Discharge status: Home / Self Care | Payer: No Typology Code available for payment source | Source: Ambulatory Visit | Attending: Hematology and Oncology | Admitting: Hematology and Oncology

## 2014-04-27 DIAGNOSIS — D492 Neoplasm of unspecified behavior of bone, soft tissue, and skin: Secondary | ICD-10-CM | POA: Insufficient documentation

## 2014-04-27 DIAGNOSIS — C50419 Malignant neoplasm of upper-outer quadrant of unspecified female breast: Secondary | ICD-10-CM | POA: Insufficient documentation

## 2014-04-27 DIAGNOSIS — C50411 Malignant neoplasm of upper-outer quadrant of right female breast: Secondary | ICD-10-CM

## 2014-04-27 MED ORDER — IOHEXOL 300 MG/ML  SOLN
100.0000 mL | Freq: Once | INTRAMUSCULAR | Status: AC | PRN
  Administered 2014-04-27: 100 mL via INTRAVENOUS

## 2014-04-27 MED ORDER — IOHEXOL 300 MG/ML  SOLN
50.0000 mL | Freq: Once | INTRAMUSCULAR | Status: AC | PRN
  Administered 2014-04-27: 50 mL via ORAL

## 2014-04-29 ENCOUNTER — Ambulatory Visit (HOSPITAL_BASED_OUTPATIENT_CLINIC_OR_DEPARTMENT_OTHER): Payer: No Typology Code available for payment source | Admitting: Hematology and Oncology

## 2014-04-29 ENCOUNTER — Telehealth: Payer: Self-pay | Admitting: Hematology and Oncology

## 2014-04-29 ENCOUNTER — Encounter: Payer: Self-pay | Admitting: *Deleted

## 2014-04-29 VITALS — BP 127/73 | HR 91 | Temp 98.3°F | Resp 18 | Ht 65.0 in | Wt 132.9 lb

## 2014-04-29 DIAGNOSIS — Z170 Estrogen receptor positive status [ER+]: Secondary | ICD-10-CM

## 2014-04-29 DIAGNOSIS — C50411 Malignant neoplasm of upper-outer quadrant of right female breast: Secondary | ICD-10-CM

## 2014-04-29 DIAGNOSIS — C50419 Malignant neoplasm of upper-outer quadrant of unspecified female breast: Secondary | ICD-10-CM

## 2014-04-29 DIAGNOSIS — R911 Solitary pulmonary nodule: Secondary | ICD-10-CM

## 2014-04-29 NOTE — Assessment & Plan Note (Signed)
The CT scans were reviewed with the patient. I do not see any lung nodules. The radiology reported a linear scar in the left costophrenic angle. The liver nodule is no longer present. I provided her with a copy of the report. There is no further indication to do routine CT scans.

## 2014-04-29 NOTE — Progress Notes (Signed)
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   01/06/2013 Breast MRI 2 enhancing masses right breast upper-outer quadrant 0.9 cm and 1.4 cm together 2.8 cm plus non-mass enhancement : Left breast scattered foci of enhancement 5 mm in size   01/15/2013 Surgery Invasive adenocarcinoma 2.9 cm margins positive, reexcision 02/23/2013 microscopic ADH likely representing "trace residual DCIS"ER/PR positive HER-2 negative, SLN negative, Oncotype DX low risk   04/26/2013 Procedure Fertility preservation   04/27/2013 - 05/26/2013 Radiation Therapy Radiation therapy to lumpectomy site    CHIEF COMPLIANT: Patient is here for followup of recent scans  INTERVAL HISTORY: Ms. Melissa Weiss is a 33 year old Caucasian lady with above-mentioned history of left breast cancer who was operated at [facility]. She was low risk Oncotype DX. Receive radiation therapy or cancer Center. I saw her week ago when she noticed a new lump in the anterior axillary fold of the right breast. Patient is scheduled for biopsy today at [facility]. She had CT scans of chest abdomen and pelvis to evaluate a previous finding of a lung nodule and liver nodule. 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 For the new nodule that she felt, she is planning to undergo another biopsy today. If she needs another surgery, she plans to discuss with her  surgeon to do bilateral mastectomies and reconstruction.  REVIEW OF SYSTEMS:   Constitutional: Denies fevers, chills or abnormal weight loss Eyes: Denies blurriness of vision Ears, nose, mouth, throat, and face: Denies mucositis or sore throat Respiratory: Denies cough, dyspnea or wheezes Cardiovascular: Denies palpitation, chest discomfort or lower extremity swelling Gastrointestinal:  Denies nausea, heartburn or change in bowel habits Skin: Denies abnormal skin rashes Lymphatics: Denies new lymphadenopathy or easy bruising Neurological:Denies numbness, tingling or new weaknesses Behavioral/Psych: Mood is stable, no new changes  Breast: There is right anterior axillary fold breast nodule All other systems were reviewed with the patient and are negative.  I have reviewed the past medical history, past surgical history, social history and family history with the patient and they are unchanged from previous note.  ALLERGIES:  is allergic to azithromycin; codeine; erythromycin; ferrous sulfate; lidocaine; promethazine hcl; and vicodin.  MEDICATIONS:  Current Outpatient Prescriptions  Medication Sig Dispense Refill  . tamoxifen (NOLVADEX) 20 MG tablet Take 1 tablet (20 mg total) by mouth daily.  30 tablet  12   No current facility-administered medications for this visit.   Facility-Administered Medications Ordered in Other Visits  Medication Dose Route Frequency Provider Last Rate Last Dose  . hyaluronate sodium (RADIAPLEXRX) gel   Topical Once Thea Silversmith, MD      . non-metallic deodorant Jethro Poling) 1 application  1 application Topical Once Thea Silversmith, MD        PHYSICAL EXAMINATION: ECOG PERFORMANCE STATUS: 0 - Asymptomatic  Filed Vitals:   04/29/14 0806  BP: 127/73  Pulse: 91  Temp: 98.3 F (36.8 C)  Resp: 18   Filed Weights   04/29/14 0806  Weight: 132 lb 14.4 oz (60.283 kg)    GENERAL:alert, no distress and comfortable SKIN: skin color, texture, turgor are  normal, no rashes or significant lesions EYES: normal, Conjunctiva are pink and non-injected, sclera clear OROPHARYNX:no exudate, no erythema and lips, buccal mucosa, and tongue normal  NECK: supple, thyroid normal size, non-tender, without nodularity LYMPH:  no palpable lymphadenopathy in the cervical, axillary or inguinal LUNGS: clear to auscultation and percussion with normal breathing effort HEART: regular rate & rhythm and no murmurs and no lower extremity edema ABDOMEN:abdomen soft, non-tender and normal bowel sounds Musculoskeletal:no cyanosis of digits and no clubbing  NEURO: alert & oriented x 3 with fluent speech, no focal motor/sensory deficits  LABORATORY DATA:  I have reviewed the data as listed   Chemistry      Component Value Date/Time   NA 139 05/11/2013 1459   NA 135 10/09/2007 1534   K 4.0 05/11/2013 1459   K 3.3* 10/09/2007 1534   CL 102 10/09/2007 1534   CO2 28 05/11/2013 1459   CO2 25 10/09/2007 1534   BUN 8.1 05/11/2013 1459   BUN 3* 10/09/2007 1534   CREATININE 0.7 05/11/2013 1459   CREATININE 0.48 10/09/2007 1534      Component Value Date/Time   CALCIUM 9.4 05/11/2013 1459   CALCIUM 8.4 10/09/2007 1534   ALKPHOS 74 05/11/2013 1459   ALKPHOS 133* 10/09/2007 1534   AST 14 05/11/2013 1459   AST 22 10/09/2007 1534   ALT 8 05/11/2013 1459   ALT 17 10/09/2007 1534   BILITOT 0.55 05/11/2013 1459   BILITOT 1.0 10/09/2007 1534       Lab Results  Component Value Date   WBC 4.3 05/11/2013   HGB 13.1 05/11/2013   HCT 38.4 05/11/2013   MCV 89.5 05/11/2013   PLT 222 05/11/2013   NEUTROABS 2.7 05/11/2013     RADIOGRAPHIC STUDIES: I have personally reviewed the radiology reports and agreed with their findings. Ct Chest W Contrast  04/27/2014   CLINICAL DATA:  History of breast carcinoma. Evaluate liver and lung lesions.  EXAM: CT CHEST, ABDOMEN, AND PELVIS WITH CONTRAST  TECHNIQUE: Multidetector CT imaging of the chest, abdomen and pelvis was performed following the standard protocol  during bolus administration of intravenous contrast.  CONTRAST:  28mL OMNIPAQUE IOHEXOL 300 MG/ML SOLN, 146mL OMNIPAQUE IOHEXOL 300 MG/ML SOLN  COMPARISON:  CT, 05/29/2013.  FINDINGS: CT CHEST FINDINGS  There is a linear opacity in the posterior costophrenic sulcus of the left lower lobe most consistent with an area of scarring. It is stable. No discrete lung nodules. Lungs are otherwise clear. No pleural effusion. No pleural based mass.  Heart is normal in size and configuration. Great vessels are normal in caliber. No mediastinal or hilar masses or adenopathy.  Surgical vascular clips are noted in the upper outer right breast. No breast masses. No neck base or axillary masses or adenopathy.  CT ABDOMEN AND PELVIS FINDINGS  Tiny low-density lesion noted in the posterior segment of the right lobe of the liver, along its inferior margin, on the prior exam, is not seen currently. Liver is normal in size and attenuation with no masses or lesions.  Normal spleen, gallbladder and pancreas. No bile duct dilation. No adrenal masses.  Normal kidneys, ureters and bladder.  Uterus and adnexa are unremarkable.  No adenopathy. Trace free fluid in the pelvis, physiologic. No abnormal fluid collections.  Colon and small bowel are  unremarkable.  Normal appendix.  MUSCULOSKELETAL:  No bony abnormality.  No osteoblastic or osteolytic lesions.  IMPRESSION: 1. No evidence of metastatic disease to the chest, abdomen and pelvis. 2. Small linear focus of density in the posterior left lower lobe at the costophrenic sulcus is essentially stable, consistent with scarring. 3. Small low-density lesion seen previously in the liver is no longer evident. Liver is normal in appearance on the current exam.   Electronically Signed   By: Amie Portland M.D.   On: 04/27/2014 16:44   Ct Abdomen Pelvis W Contrast  04/27/2014   CLINICAL DATA:  History of breast carcinoma. Evaluate liver and lung lesions.  EXAM: CT CHEST, ABDOMEN, AND PELVIS WITH  CONTRAST  TECHNIQUE: Multidetector CT imaging of the chest, abdomen and pelvis was performed following the standard protocol during bolus administration of intravenous contrast.  CONTRAST:  60mL OMNIPAQUE IOHEXOL 300 MG/ML SOLN, OMNIPAQUE IOHEXOL 300 MG/ML SOLN  COMPARISON:  CT, 05/29/2013.  FINDINGS: CT CHEST FINDINGS  There is a linear opacity in the posterior costophrenic sulcus of the left lower lobe most consistent with an area of scarring. It is stable. No discrete lung nodules. Lungs are otherwise clear. No pleural effusion. No pleural based mass.  Heart is normal in size and configuration. Great vessels are normal in caliber. No mediastinal or hilar masses or adenopathy.  Surgical vascular clips are noted in the upper outer right breast. No breast masses. No neck base or axillary masses or adenopathy.  CT ABDOMEN AND PELVIS FINDINGS  Tiny low-density lesion noted in the posterior segment of the right lobe of the liver, along its inferior margin, on the prior exam, is not seen currently. Liver is normal in size and attenuation with no masses or lesions.  Normal spleen, gallbladder and pancreas. No bile duct dilation. No adrenal masses.  Normal kidneys, ureters and bladder.  Uterus and adnexa are unremarkable.  No adenopathy. Trace free fluid in the pelvis, physiologic. No abnormal fluid collections.  Colon and small bowel are unremarkable.  Normal appendix.  MUSCULOSKELETAL:  No bony abnormality.  No osteoblastic or osteolytic lesions.  IMPRESSION: 1. No evidence of metastatic disease to the chest, abdomen and pelvis. 2. Small linear focus of density in the posterior left lower lobe at the costophrenic sulcus is essentially stable, consistent with scarring. 3. Small low-density lesion seen previously in the liver is no longer evident. Liver is normal in appearance on the current exam.   Electronically Signed   By: Amie Portland M.D.   On: 04/27/2014 16:44     ASSESSMENT & PLAN:  Breast cancer of  upper-outer quadrant of right female breast Right breast adenocarcinoma T2, N0, M0 stage II A. ER/PR positive HER-2 negative status post lumpectomy and radiation currently on hormonal therapy with tamoxifen Patient is undergoing a biopsy today at [facility]. She will then follow with her surgeon over there and decide what she wants to do. If she has to undergo such frequent imaging and biopsies, she might prefer to do bilateral mastectomies instead.  Lung nodule The CT scans were reviewed with the patient. I do not see any lung nodules. The radiology reported a linear scar in the left costophrenic angle. The liver nodule is no longer present. I provided her with a copy of the report. There is no further indication to do routine CT scans.    No orders of the defined types were placed in this encounter.   The patient has a good understanding of the  overall plan. she agrees with it. She will call with any problems that may develop before her next visit here.  I spent 20 minutes counseling the patient face to face. The total time spent in the appointment was 25 minutes and more than 50% was on counseling and review of test results    Rulon Eisenmenger, MD 04/29/2014 8:41 AM

## 2014-04-29 NOTE — Assessment & Plan Note (Signed)
Right breast adenocarcinoma T2, N0, M0 stage II A. ER/PR positive HER-2 negative status post lumpectomy and radiation currently on hormonal therapy with tamoxifen Patient is undergoing a biopsy today at [facility]. She will then follow with her surgeon over there and decide what she wants to do. If she has to undergo such frequent imaging and biopsies, she might prefer to do bilateral mastectomies instead.

## 2014-04-29 NOTE — Telephone Encounter (Signed)
, °

## 2014-04-30 ENCOUNTER — Ambulatory Visit
Admission: RE | Admit: 2014-04-30 | Payer: No Typology Code available for payment source | Source: Ambulatory Visit | Admitting: Radiation Oncology

## 2014-06-04 ENCOUNTER — Telehealth: Payer: Self-pay | Admitting: *Deleted

## 2014-06-04 NOTE — Telephone Encounter (Signed)
Patient called stating that she had found another lump in her right breast next to the one she found earlier. Wanted to be set up for ultrasound/biopsy at [facility]. [place] at [phone] and scheduled ultrasound for 06/07/14 at 10:00 in clinic 2-1. Patient verbalized understanding of appt.

## 2014-07-21 ENCOUNTER — Telehealth: Payer: Self-pay | Admitting: Hematology and Oncology

## 2014-07-21 NOTE — Telephone Encounter (Signed)
, °

## 2014-07-22 ENCOUNTER — Ambulatory Visit: Payer: No Typology Code available for payment source | Admitting: Hematology and Oncology

## 2014-08-03 ENCOUNTER — Ambulatory Visit: Payer: No Typology Code available for payment source | Admitting: Hematology and Oncology

## 2014-08-10 ENCOUNTER — Other Ambulatory Visit: Payer: Self-pay | Admitting: *Deleted

## 2014-08-10 ENCOUNTER — Ambulatory Visit (HOSPITAL_BASED_OUTPATIENT_CLINIC_OR_DEPARTMENT_OTHER): Payer: No Typology Code available for payment source

## 2014-08-10 ENCOUNTER — Ambulatory Visit (HOSPITAL_BASED_OUTPATIENT_CLINIC_OR_DEPARTMENT_OTHER): Payer: No Typology Code available for payment source | Admitting: Hematology and Oncology

## 2014-08-10 DIAGNOSIS — C50411 Malignant neoplasm of upper-outer quadrant of right female breast: Secondary | ICD-10-CM

## 2014-08-10 DIAGNOSIS — R5383 Other fatigue: Secondary | ICD-10-CM

## 2014-08-10 DIAGNOSIS — L989 Disorder of the skin and subcutaneous tissue, unspecified: Secondary | ICD-10-CM

## 2014-08-10 DIAGNOSIS — N63 Unspecified lump in breast: Secondary | ICD-10-CM

## 2014-08-10 LAB — CBC WITH DIFFERENTIAL/PLATELET
BASO%: 0.2 % (ref 0.0–2.0)
Basophils Absolute: 0 10*3/uL (ref 0.0–0.1)
EOS%: 1.3 % (ref 0.0–7.0)
Eosinophils Absolute: 0.1 10*3/uL (ref 0.0–0.5)
HCT: 34.7 % — ABNORMAL LOW (ref 34.8–46.6)
HEMOGLOBIN: 11.3 g/dL — AB (ref 11.6–15.9)
LYMPH%: 23.4 % (ref 14.0–49.7)
MCH: 27.4 pg (ref 25.1–34.0)
MCHC: 32.6 g/dL (ref 31.5–36.0)
MCV: 84.2 fL (ref 79.5–101.0)
MONO#: 0.4 10*3/uL (ref 0.1–0.9)
MONO%: 9.7 % (ref 0.0–14.0)
NEUT#: 3 10*3/uL (ref 1.5–6.5)
NEUT%: 65.4 % (ref 38.4–76.8)
Platelets: 200 10*3/uL (ref 145–400)
RBC: 4.12 10*6/uL (ref 3.70–5.45)
RDW: 14.9 % — AB (ref 11.2–14.5)
WBC: 4.5 10*3/uL (ref 3.9–10.3)
lymph#: 1.1 10*3/uL (ref 0.9–3.3)

## 2014-08-10 LAB — COMPREHENSIVE METABOLIC PANEL (CC13)
ALBUMIN: 3.8 g/dL (ref 3.5–5.0)
ALT: 10 U/L (ref 0–55)
ANION GAP: 8 meq/L (ref 3–11)
AST: 15 U/L (ref 5–34)
Alkaline Phosphatase: 67 U/L (ref 40–150)
BUN: 14.2 mg/dL (ref 7.0–26.0)
CALCIUM: 9.3 mg/dL (ref 8.4–10.4)
CHLORIDE: 105 meq/L (ref 98–109)
CO2: 28 meq/L (ref 22–29)
Creatinine: 0.8 mg/dL (ref 0.6–1.1)
EGFR: >90 mL/min/{1.73_m2} (ref >90)
Glucose: 76 mg/dl (ref 70–140)
POTASSIUM: 4.3 meq/L (ref 3.5–5.1)
Sodium: 140 mEq/L (ref 136–145)
Total Bilirubin: 0.48 mg/dL (ref 0.20–1.20)
Total Protein: 7.1 g/dL (ref 6.4–8.3)

## 2014-08-10 LAB — IRON AND TIBC CHCC
%SAT: 9 % — ABNORMAL LOW (ref 21–57)
Iron: 35 ug/dL — ABNORMAL LOW (ref 41–142)
TIBC: 393 ug/dL (ref 236–444)
UIBC: 357 ug/dL (ref 120–384)

## 2014-08-10 LAB — TSH CHCC: TSH: 0.589 m(IU)/L (ref 0.308–3.960)

## 2014-08-10 LAB — FERRITIN CHCC: FERRITIN: 4 ng/mL — AB (ref 9–269)

## 2014-08-10 LAB — MAGNESIUM (CC13): Magnesium: 2.4 mg/dl (ref 1.5–2.5)

## 2014-08-10 NOTE — Assessment & Plan Note (Signed)
Right breast adenocarcinoma T2, N0, M0 stage II A. ER/PR positive HER-2 negative status post lumpectomy and radiation currently on hormonal therapy with tamoxifen.  New breast lesions: previous biopsy was scar tissue she has another biopsies being set up by radiology [facility] within the next week.  Profound fatigue along with sudden onset of blurring of vision with severe headache : patient reports it almost felt like aura and migraine but she is never had migraines before. Since since her energy levels have not recovered. In order to evaluate her brain we will obtain a brain MRI.  Right thigh lesion: even though to my physical examination he does not feel significant, patient is extremely worried about it hence I will request interventional radiology to do an ultrasound-guided biopsy of the right thigh lesion.  Severe fatigue: I left up in blood work today CBC with differential ion studies W-86 and folic acid and vitamin D, CMP,  thyroid function tests. I will call her with the results of these tests.   Return to clinic in 2 weeks for follow-up

## 2014-08-10 NOTE — Progress Notes (Signed)
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   01/06/2013 Breast MRI 2 enhancing masses right breast upper-outer quadrant 0.9 cm and 1.4 cm together 2.8 cm plus non-mass enhancement : Left breast scattered foci of enhancement 5 mm in size   01/15/2013 Surgery Invasive adenocarcinoma 2.9 cm margins positive, reexcision 02/23/2013 microscopic ADH likely representing "trace residual DCIS"ER/PR positive HER-2 negative, SLN negative, Oncotype DX low risk   04/26/2013 Procedure Fertility preservation   04/27/2013 - 05/26/2013 Radiation Therapy Radiation therapy to lumpectomy site    CHIEF COMPLIANT:  Follow-up of left breast cancer , severe fatigue, headache and migraine-like symptoms , right thigh lesion  INTERVAL HISTORY: Melissa Weiss is a  34 year old lady with above-mentioned history of left-sided breast cancer who is being managed at [facility]. She presents with multiple nodules periodically that need biopsies. She had a nodule in September that was biopsy-proven to be scar tissue. She now has another nodule which is scheduled to be biopsied next month. She comes in today complaining of profound fatigue for the past month. She also had one episode of severe visual blurring that lasted for about an hour accompanied by headache and nausea. It felt like she had a migraine attack. She did not have the symptom anymore since then. She is also very concerned about the right thigh lesion as to whether it can be another sign of cancer.  REVIEW  OF SYSTEMS:   Constitutional: Denies fevers, chills or abnormal weight loss Eyes: Denies blurriness of vision Ears, nose, mouth, throat, and face: Denies mucositis or sore throat Respiratory: Denies cough, dyspnea or wheezes Cardiovascular: Denies palpitation, chest discomfort or lower extremity swelling Gastrointestinal:  Denies nausea, heartburn or change in bowel habits Skin: Denies abnormal skin rashes Lymphatics: Denies new lymphadenopathy or easy bruising Neurological:Denies numbness, tingling or new weaknesses Behavioral/Psych:  Extremely anxious and worried about breast cancer relapse Breast:  New breast nodules being biopsied at [facility] All other systems were reviewed with the patient and are negative.  I have reviewed the past medical history, past surgical history, social history and family history with the patient and they are unchanged from previous note.  ALLERGIES:  is allergic to azithromycin; codeine; erythromycin; ferrous sulfate; lidocaine; promethazine hcl; and vicodin.  MEDICATIONS:  No current outpatient prescriptions on file.   No current facility-administered medications for this visit.   Facility-Administered Medications Ordered in Other Visits  Medication Dose Route Frequency Provider Last Rate Last Dose  . hyaluronate sodium (RADIAPLEXRX) gel   Topical Once Thea Silversmith, MD      . non-metallic deodorant Jethro Poling) 1 application  1 application Topical Once Thea Silversmith, MD        PHYSICAL EXAMINATION: ECOG PERFORMANCE STATUS: 0 - Asymptomatic  Filed Vitals:   08/10/14 1035  BP: 122/74  Pulse: 87  Temp: 98.2 F (36.8 C)  Resp: 18   Filed Weights   08/10/14 1035  Weight: 135 lb 1.6 oz (61.281 kg)    GENERAL:alert, no distress and comfortable SKIN: skin  color, texture, turgor are normal, no rashes or significant lesions EYES: normal, Conjunctiva are pink and non-injected, sclera clear OROPHARYNX:no exudate, no erythema and lips, buccal  mucosa, and tongue normal  NECK: supple, thyroid normal size, non-tender, without nodularity LYMPH:  no palpable lymphadenopathy in the cervical, axillary or inguinal LUNGS: clear to auscultation and percussion with normal breathing effort HEART: regular rate & rhythm and no murmurs and no lower extremity edema ABDOMEN:abdomen soft, non-tender and normal bowel sounds Musculoskeletal:no cyanosis of digits and no clubbing  NEURO: alert & oriented x 3 with fluent speech, no focal motor/sensory deficits  LABORATORY DATA:  I have reviewed the data as listed   Chemistry      Component Value Date/Time   NA 139 05/11/2013 1459   NA 135 10/09/2007 1534   K 4.0 05/11/2013 1459   K 3.3* 10/09/2007 1534   CL 102 10/09/2007 1534   CO2 28 05/11/2013 1459   CO2 25 10/09/2007 1534   BUN 8.1 05/11/2013 1459   BUN 3* 10/09/2007 1534   CREATININE 0.7 05/11/2013 1459   CREATININE 0.48 10/09/2007 1534      Component Value Date/Time   CALCIUM 9.4 05/11/2013 1459   CALCIUM 8.4 10/09/2007 1534   ALKPHOS 74 05/11/2013 1459   ALKPHOS 133* 10/09/2007 1534   AST 14 05/11/2013 1459   AST 22 10/09/2007 1534   ALT 8 05/11/2013 1459   ALT 17 10/09/2007 1534   BILITOT 0.55 05/11/2013 1459   BILITOT 1.0 10/09/2007 1534       Lab Results  Component Value Date   WBC 4.3 05/11/2013   HGB 13.1 05/11/2013   HCT 38.4 05/11/2013   MCV 89.5 05/11/2013   PLT 222 05/11/2013   NEUTROABS 2.7 05/11/2013    ASSESSMENT & PLAN:  Breast cancer of upper-outer quadrant of right female breast Right breast adenocarcinoma T2, N0, M0 stage II A. ER/PR positive HER-2 negative status post lumpectomy and radiation currently on hormonal therapy with tamoxifen.  New breast lesions: previous biopsy was scar tissue she has another biopsies being set up by radiology [facility] within the next week.  Profound fatigue along with sudden onset of blurring of vision with severe headache : patient reports it almost felt like  aura and migraine but she is never had migraines before. Since since her energy levels have not recovered. In order to evaluate her brain we will obtain a brain MRI.  Right thigh lesion: even though to my physical examination he does not feel significant, patient is extremely worried about it hence I will request interventional radiology to do an ultrasound-guided biopsy of the right thigh lesion.  Severe fatigue: I left up in blood work today CBC with differential ion studies R-00 and folic acid and vitamin D, CMP,  thyroid function tests. I will call her with the results of these tests.   Return to clinic in 2 weeks for follow-up   Orders Placed This Encounter  Procedures  . MR Brain W Contrast    Standing Status: Future     Number of Occurrences:      Standing Expiration Date: 08/10/2015    Order Specific Question:  Reason for Exam (SYMPTOM  OR DIAGNOSIS REQUIRED)    Answer:  Blurring of vision, Sudden onset. with H/O breast cancer    Order Specific Question:  Preferred imaging location?    Answer:  [facility]    Order Specific Question:  Does the patient have a pacemaker or implanted devices?  Answer:  No    Order Specific Question:  What is the patient's sedation requirement?    Answer:  No Sedation  . CBC with Differential    Standing Status: Future     Number of Occurrences:      Standing Expiration Date: 08/10/2015  . Comprehensive metabolic panel (Cmet) - [facility]    Standing Status: Future     Number of Occurrences:      Standing Expiration Date: 08/10/2015  . Vitamin D 25 hydroxy    Standing Status: Future     Number of Occurrences:      Standing Expiration Date: 08/10/2015  . Magnesium - [facility]    Standing Status: Future     Number of Occurrences:      Standing Expiration Date: 08/10/2015  . Vitamin B12    Standing Status: Future     Number of Occurrences:      Standing Expiration Date: 08/10/2015  . Folate, Serum    Standing Status: Future     Number of  Occurrences:      Standing Expiration Date: 08/10/2015  . TSH    Standing Status: Future     Number of Occurrences:      Standing Expiration Date: 09/14/2015  . T3, free    Standing Status: Future     Number of Occurrences:      Standing Expiration Date: 08/10/2015  . T4, free    Standing Status: Future     Number of Occurrences:      Standing Expiration Date: 08/10/2015  . Iron and TIBC [facility]    Standing Status: Future     Number of Occurrences:      Standing Expiration Date: 08/10/2015  . Ambulatory referral to Interventional Radiology    Referral Priority:  Routine    Referral Type:  Consultation    Referral Reason:  Specialty Services Required    Requested Specialty:  Interventional Radiology    Number of Visits Requested:  1   The patient has a good understanding of the overall plan. she agrees with it. She will call with any problems that may develop before her next visit here.   Rulon Eisenmenger, MD 08/10/2014 11:13 AM

## 2014-08-11 ENCOUNTER — Telehealth: Payer: Self-pay | Admitting: Hematology and Oncology

## 2014-08-11 ENCOUNTER — Other Ambulatory Visit: Payer: Self-pay | Admitting: *Deleted

## 2014-08-11 DIAGNOSIS — C50411 Malignant neoplasm of upper-outer quadrant of right female breast: Secondary | ICD-10-CM

## 2014-08-11 LAB — T4, FREE: FREE T4: 1.2 ng/dL (ref 0.80–1.80)

## 2014-08-11 LAB — VITAMIN D 25 HYDROXY (VIT D DEFICIENCY, FRACTURES): Vit D, 25-Hydroxy: 26 ng/mL — ABNORMAL LOW (ref 30–100)

## 2014-08-11 LAB — FOLATE: Folate: 13.6 ng/mL

## 2014-08-11 LAB — VITAMIN B12: VITAMIN B 12: 417 pg/mL (ref 211–911)

## 2014-08-11 LAB — T3, FREE: T3, Free: 2.9 pg/mL (ref 2.3–4.2)

## 2014-08-16 ENCOUNTER — Telehealth: Payer: Self-pay | Admitting: Hematology and Oncology

## 2014-08-16 NOTE — Telephone Encounter (Signed)
, °

## 2014-08-24 ENCOUNTER — Other Ambulatory Visit: Payer: No Typology Code available for payment source

## 2014-08-24 ENCOUNTER — Other Ambulatory Visit: Payer: Self-pay | Admitting: Radiology

## 2014-08-24 ENCOUNTER — Ambulatory Visit: Payer: No Typology Code available for payment source | Admitting: Hematology and Oncology

## 2014-08-24 ENCOUNTER — Ambulatory Visit (HOSPITAL_COMMUNITY): Payer: No Typology Code available for payment source

## 2014-08-25 ENCOUNTER — Ambulatory Visit (HOSPITAL_COMMUNITY): Admission: RE | Admit: 2014-08-25 | Payer: No Typology Code available for payment source | Source: Ambulatory Visit

## 2014-08-27 ENCOUNTER — Ambulatory Visit (HOSPITAL_COMMUNITY): Payer: No Typology Code available for payment source

## 2014-09-01 ENCOUNTER — Telehealth: Payer: Self-pay | Admitting: Hematology and Oncology

## 2014-09-01 NOTE — Telephone Encounter (Signed)
, °

## 2014-09-02 ENCOUNTER — Ambulatory Visit: Payer: No Typology Code available for payment source | Admitting: Hematology and Oncology

## 2014-09-02 ENCOUNTER — Other Ambulatory Visit: Payer: No Typology Code available for payment source

## 2014-09-20 ENCOUNTER — Ambulatory Visit (HOSPITAL_COMMUNITY): Admission: RE | Admit: 2014-09-20 | Payer: No Typology Code available for payment source | Source: Ambulatory Visit

## 2014-09-24 ENCOUNTER — Ambulatory Visit (HOSPITAL_COMMUNITY): Payer: No Typology Code available for payment source

## 2014-11-24 ENCOUNTER — Ambulatory Visit (HOSPITAL_COMMUNITY): Payer: No Typology Code available for payment source

## 2014-12-06 ENCOUNTER — Telehealth: Payer: Self-pay

## 2014-12-06 NOTE — Telephone Encounter (Signed)
Melissa Weiss, scheduler at [facility] radiology called to let D Gudena know pt has cancelled her US biopsy. This is 4th cancellation sonce January. Carrie's call back [phone]

## 2014-12-07 ENCOUNTER — Ambulatory Visit (HOSPITAL_COMMUNITY): Admission: RE | Admit: 2014-12-07 | Payer: No Typology Code available for payment source | Source: Ambulatory Visit

## 2015-01-17 ENCOUNTER — Telehealth: Payer: Self-pay | Admitting: *Deleted

## 2015-01-17 NOTE — Telephone Encounter (Signed)
VM message from patient stating that she new some new breast lumps/lesions and is feeling poorly, very fatigued. She would like to see Dr. Lindi Adie as soon as possible. Last visit was January of this year. None scheduled after that.

## 2015-01-20 ENCOUNTER — Telehealth: Payer: Self-pay | Admitting: Hematology and Oncology

## 2015-01-20 NOTE — Telephone Encounter (Signed)
PATIENT CALLED IN TO RESCHEDULE A PREVIOUS CX APPOINTMENT

## 2015-01-20 NOTE — Telephone Encounter (Signed)
LMOVM - let pt know 1st appt available is July 5.  Pt to call clinic if she wants to schedule that.

## 2015-01-20 NOTE — Telephone Encounter (Signed)
Return call received today from patient reporting she has not received return call yet.  Observed this phone note.  Further assessment she reports "feeling a lot more knots and lumps in her right breast and concerned.  Mine do not show up on mammogram,  I am not on Tamoxifen.  I want to get pregnant but am afraid.  I need to see him within the next two weeks."  No further appointments noted as she cancelled two week F/U in January.  "I hate coming to see you all."  Understandably advised that when she has an appointment to try to keep it as our practice grows tremendously making appointments fill and availability disappear.    Will notify Dr. Lindi Adie.

## 2015-02-17 ENCOUNTER — Other Ambulatory Visit: Payer: Self-pay

## 2015-02-17 DIAGNOSIS — C50411 Malignant neoplasm of upper-outer quadrant of right female breast: Secondary | ICD-10-CM

## 2015-02-17 NOTE — Assessment & Plan Note (Signed)
Right breast adenocarcinoma T2, N0, M0 stage II A. ER/PR positive HER-2 negative status post lumpectomy and radiation   New breast lesions: previous biopsy was scar tissue and she continues to complain of breast lumps Right Thigh Lesion: Cancelled biopsy (4th cancellation in several months) Severe Fatigue Blurring of vision: Previously ordered MRI brain. She did not do it.  I believe she needs psychiatric assistance. She has a tendency to cancel appointments and later call with complaints of breast lumps as well as other somatic complaints.

## 2015-02-18 ENCOUNTER — Telehealth: Payer: Self-pay

## 2015-02-18 ENCOUNTER — Encounter: Payer: Self-pay | Admitting: Hematology and Oncology

## 2015-02-18 ENCOUNTER — Other Ambulatory Visit (HOSPITAL_BASED_OUTPATIENT_CLINIC_OR_DEPARTMENT_OTHER): Payer: BLUE CROSS/BLUE SHIELD

## 2015-02-18 ENCOUNTER — Ambulatory Visit (HOSPITAL_BASED_OUTPATIENT_CLINIC_OR_DEPARTMENT_OTHER): Payer: BLUE CROSS/BLUE SHIELD | Admitting: Hematology and Oncology

## 2015-02-18 VITALS — BP 115/79 | HR 92 | Temp 98.3°F | Resp 18 | Ht 65.0 in | Wt 132.9 lb

## 2015-02-18 DIAGNOSIS — D509 Iron deficiency anemia, unspecified: Secondary | ICD-10-CM

## 2015-02-18 DIAGNOSIS — C50411 Malignant neoplasm of upper-outer quadrant of right female breast: Secondary | ICD-10-CM

## 2015-02-18 DIAGNOSIS — Z170 Estrogen receptor positive status [ER+]: Secondary | ICD-10-CM

## 2015-02-18 LAB — CBC WITH DIFFERENTIAL/PLATELET
BASO%: 0.2 % (ref 0.0–2.0)
Basophils Absolute: 0 10*3/uL (ref 0.0–0.1)
EOS ABS: 0.1 10*3/uL (ref 0.0–0.5)
EOS%: 1 % (ref 0.0–7.0)
HEMATOCRIT: 35.6 % (ref 34.8–46.6)
HGB: 11.7 g/dL (ref 11.6–15.9)
LYMPH%: 18.5 % (ref 14.0–49.7)
MCH: 26.1 pg (ref 25.1–34.0)
MCHC: 32.9 g/dL (ref 31.5–36.0)
MCV: 79.6 fL (ref 79.5–101.0)
MONO#: 0.4 10*3/uL (ref 0.1–0.9)
MONO%: 8.2 % (ref 0.0–14.0)
NEUT#: 3.6 10*3/uL (ref 1.5–6.5)
NEUT%: 72.1 % (ref 38.4–76.8)
Platelets: 182 10*3/uL (ref 145–400)
RBC: 4.47 10*6/uL (ref 3.70–5.45)
RDW: 17.2 % — ABNORMAL HIGH (ref 11.2–14.5)
WBC: 5 10*3/uL (ref 3.9–10.3)
lymph#: 0.9 10*3/uL (ref 0.9–3.3)

## 2015-02-18 LAB — COMPREHENSIVE METABOLIC PANEL (CC13)
ALT: 11 U/L (ref 0–55)
AST: 16 U/L (ref 5–34)
Albumin: 3.8 g/dL (ref 3.5–5.0)
Alkaline Phosphatase: 64 U/L (ref 40–150)
Anion Gap: 6 mEq/L (ref 3–11)
BUN: 13 mg/dL (ref 7.0–26.0)
CHLORIDE: 107 meq/L (ref 98–109)
CO2: 27 meq/L (ref 22–29)
CREATININE: 0.8 mg/dL (ref 0.6–1.1)
Calcium: 9.4 mg/dL (ref 8.4–10.4)
Glucose: 92 mg/dl (ref 70–140)
Potassium: 4.6 mEq/L (ref 3.5–5.1)
Sodium: 140 mEq/L (ref 136–145)
Total Bilirubin: 0.61 mg/dL (ref 0.20–1.20)
Total Protein: 7.1 g/dL (ref 6.4–8.3)

## 2015-02-18 NOTE — Addendum Note (Signed)
Addended by: Prentiss Bells on: 02/18/2015 12:05 PM   Modules accepted: Orders

## 2015-02-18 NOTE — Progress Notes (Signed)
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   01/06/2013 Breast MRI 2 enhancing masses right breast upper-outer quadrant 0.9 cm and 1.4 cm together 2.8 cm plus non-mass enhancement : Left breast scattered foci of enhancement 5 mm in size   01/15/2013 Surgery Invasive adenocarcinoma 2.9 cm margins positive, reexcision 02/23/2013 microscopic ADH likely representing "trace residual DCIS"ER/PR positive HER-2 negative, SLN negative, Oncotype DX low risk   04/26/2013 Procedure Fertility preservation   04/27/2013 - 05/26/2013 Radiation Therapy Radiation therapy to lumpectomy site    CHIEF COMPLIANT: Follow-up to discuss treatment plans  INTERVAL HISTORY: Melissa Weiss is a 34 year old above-mentioned history of right-sided breast cancer who underwent lumpectomy radiation and refused tamoxifen therapy. She has had over 30 biopsies in her breasts done at [facility] all of which have shown fat Necrosis or scar tissue. She also had heavy uterine bleeding and had seen a gynecologist there who recommended D&C but apparently she did not follow through on it. She is considering doing bilateral mastectomy. She has a subcutaneous lump on her thigh which has been stable for the past 10 years.  REVIEW OF SYSTEMS:   Constitutional: Denies fevers, chills or abnormal weight loss Eyes: Denies blurriness of vision Ears, nose, mouth, throat, and face: Denies mucositis or sore throat Respiratory: Denies cough, dyspnea or wheezes Cardiovascular: Denies  palpitation, chest discomfort or lower extremity swelling Gastrointestinal:  Denies nausea, heartburn or change in bowel habits Skin: Denies abnormal skin rashes Lymphatics: Denies new lymphadenopathy or easy bruising Neurological:Denies numbness, tingling or new weaknesses Behavioral/Psych: Mood is stable, no new changes  Breast: Multiple lumps or nodules benign scar tissue and fat necrosis All other systems were reviewed with the patient and are negative.  I have reviewed the past medical history, past surgical history, social history and family history with the patient and they are unchanged from previous note.  ALLERGIES:  is allergic to azithromycin; codeine; erythromycin; ferrous sulfate; lidocaine; promethazine hcl; and vicodin.  MEDICATIONS:  No current outpatient prescriptions on file.   No current facility-administered medications for this visit.   Facility-Administered Medications Ordered in Other Visits  Medication Dose Route Frequency Provider Last Rate Last Dose  . hyaluronate sodium (RADIAPLEXRX) gel   Topical Once Thea Silversmith, MD      . non-metallic deodorant Jethro Poling) 1 application  1 application Topical Once Thea Silversmith, MD        PHYSICAL EXAMINATION: ECOG PERFORMANCE STATUS: 1 - Symptomatic but completely ambulatory  There were no vitals filed for this visit. There were no vitals filed for this visit.  GENERAL:alert, no distress and comfortable SKIN: skin color, texture, turgor are normal, no rashes or significant lesions EYES: normal, Conjunctiva are pink and non-injected, sclera clear OROPHARYNX:no exudate, no erythema and lips, buccal mucosa, and tongue normal  NECK: supple, thyroid normal size, non-tender, without nodularity LYMPH:  no palpable lymphadenopathy in the cervical, axillary or inguinal LUNGS: clear to auscultation and percussion with normal breathing effort HEART: regular rate & rhythm and no murmurs and  no lower extremity  edema ABDOMEN:abdomen soft, non-tender and normal bowel sounds Musculoskeletal:no cyanosis of digits and no clubbing  NEURO: alert & oriented x 3 with fluent speech, no focal motor/sensory deficits  LABORATORY DATA:  I have reviewed the data as listed   Chemistry      Component Value Date/Time   NA 140 08/10/2014 1121   NA 135 10/09/2007 1534   K 4.3 08/10/2014 1121   K 3.3* 10/09/2007 1534   CL 102 10/09/2007 1534   CO2 28 08/10/2014 1121   CO2 25 10/09/2007 1534   BUN 14.2 08/10/2014 1121   BUN 3* 10/09/2007 1534   CREATININE 0.8 08/10/2014 1121   CREATININE 0.48 10/09/2007 1534      Component Value Date/Time   CALCIUM 9.3 08/10/2014 1121   CALCIUM 8.4 10/09/2007 1534   ALKPHOS 67 08/10/2014 1121   ALKPHOS 133* 10/09/2007 1534   AST 15 08/10/2014 1121   AST 22 10/09/2007 1534   ALT 10 08/10/2014 1121   ALT 17 10/09/2007 1534   BILITOT 0.48 08/10/2014 1121   BILITOT 1.0 10/09/2007 1534       Lab Results  Component Value Date   WBC 4.5 08/10/2014   HGB 11.3* 08/10/2014   HCT 34.7* 08/10/2014   MCV 84.2 08/10/2014   PLT 200 08/10/2014   NEUTROABS 3.0 08/10/2014    ASSESSMENT & PLAN:  Breast cancer of upper-outer quadrant of right female breast Right breast adenocarcinoma T2, N0, M0 stage II A. ER/PR positive HER-2 negative status post lumpectomy and radiation. Patient denied taking tamoxifen.    New breast lesions: previous biopsy was scar tissue and she continues to complain of breast lumps Right Thigh Lesion: Cancelled biopsy (4th cancellation in several months): She would like to be biopsied once again.  Severe Fatigue Blurring of vision: Previously ordered MRI brain. She did not do it.  Procedures done at [facility]: Right breast biopsy 10/07/2014: Benign breast tissue with postsurgical changes including fat necrosis, fibrosis and chronic inflammation Right breast biopsy 08/17/2014: Right breast mass 11:00 was 3.1 x 1.2 x 1.8 cm fat necrosis, deep to  this a small hypoechoic mass 0.8 cm calcified oil cyst Evaluation for menorrhagia 08/11/2014: The recommended D&C and she canceled the procedure Left breast mammogram 03/18/2014: Dense breasts, ultrasound-guided biopsy  She has a tendency to cancel appointments and later call with complaints of breast lumps as well as other somatic complaints. We decided to not make routine follow-ups. She might decide to do bilateral mastectomies. I discussed that this would reduce her risk of breast cancer less than 5%. She will also make another appointment to undergo D&C procedure. She would like [place] to give her a letter that describes her condition which will enable her to not transfer to [place].  No follow-ups with [place] will be scheduled.  Iron deficiency anemia: Due to heavy menstrual bleeding. She does not tolerate oral iron and does not want to take IV iron because her mother was allergy to IV iron. She'll plan to do a D&C and will eat iron rich foods.   No orders of the defined types were placed in this encounter.   The patient has a good understanding of the overall plan. she agrees with it. she will call with any problems that may develop before the next visit here.   Rulon Eisenmenger, MD

## 2015-02-18 NOTE — Telephone Encounter (Signed)
Let pt know [facility] ready for pickup at front desk.  Pt voiced understanding.

## 2015-02-21 ENCOUNTER — Telehealth (HOSPITAL_COMMUNITY): Payer: Self-pay | Admitting: *Deleted

## 2015-02-21 ENCOUNTER — Other Ambulatory Visit: Payer: Self-pay | Admitting: Hematology and Oncology

## 2015-02-21 DIAGNOSIS — C50411 Malignant neoplasm of upper-outer quadrant of right female breast: Secondary | ICD-10-CM

## 2015-02-21 NOTE — Progress Notes (Signed)
Dr Lindi Adie letter re: pt moving to [place] placed at front for pt pickup.  Pt voiced understanding.

## 2015-02-23 ENCOUNTER — Telehealth: Payer: Self-pay | Admitting: *Deleted

## 2015-02-23 NOTE — Telephone Encounter (Signed)
Received office notes from [facility], sent to scan.

## 2015-03-07 ENCOUNTER — Other Ambulatory Visit: Payer: Self-pay | Admitting: Hematology and Oncology

## 2015-03-07 ENCOUNTER — Telehealth: Payer: Self-pay

## 2015-03-07 ENCOUNTER — Ambulatory Visit (HOSPITAL_COMMUNITY)
Admission: RE | Admit: 2015-03-07 | Discharge: 2015-03-07 | Disposition: A | Discharge status: Home / Self Care | Payer: No Typology Code available for payment source | Source: Ambulatory Visit | Attending: Hematology and Oncology | Admitting: Hematology and Oncology

## 2015-03-07 ENCOUNTER — Encounter (HOSPITAL_COMMUNITY): Payer: Self-pay

## 2015-03-07 DIAGNOSIS — C50411 Malignant neoplasm of upper-outer quadrant of right female breast: Secondary | ICD-10-CM

## 2015-03-07 DIAGNOSIS — Z923 Personal history of irradiation: Secondary | ICD-10-CM | POA: Diagnosis not present

## 2015-03-07 DIAGNOSIS — I8391 Asymptomatic varicose veins of right lower extremity: Secondary | ICD-10-CM | POA: Diagnosis not present

## 2015-03-07 MED ORDER — IOHEXOL 300 MG/ML  SOLN
100.0000 mL | Freq: Once | INTRAMUSCULAR | Status: AC | PRN
  Administered 2015-03-07: 100 mL via INTRAVENOUS

## 2015-03-07 NOTE — Telephone Encounter (Signed)
LMOVM - CT scans show only small varicosities at site on her leg she was concerned about.  No need to worry - nothing to biopsy.  Pt to call clinic if she has any questions.

## 2015-04-25 ENCOUNTER — Telehealth: Payer: Self-pay

## 2015-04-25 DIAGNOSIS — C50411 Malignant neoplasm of upper-outer quadrant of right female breast: Secondary | ICD-10-CM

## 2015-04-25 NOTE — Telephone Encounter (Signed)
Returned pt call to after hours line re: heavy menstrual cycles.  Pt reports she feels weak and tired - believes she may be anemic.  Let pt know we could check her CBC and that she should contact gyn about the bleeding.  Pt reports her previous gyn retired and she does not have a new one yet.  She previously went to [facility].  Letp t know her best chance to get in to gyn would be with [facility] since she is a former pt there.  Pt will attempt to call them and let us know if she needs help with getting an appt.  CBC order faxed to [facility] lab.  Pt provided with instructions.  Let pt know I would contact her when lab results rcvd.  Pt voiced understanding.    [facility] call report sent to scan.  Lab order sent to scan.

## 2015-06-01 ENCOUNTER — Telehealth: Payer: Self-pay | Admitting: *Deleted

## 2015-06-01 ENCOUNTER — Other Ambulatory Visit: Payer: Self-pay | Admitting: *Deleted

## 2015-06-01 DIAGNOSIS — C50411 Malignant neoplasm of upper-outer quadrant of right female breast: Secondary | ICD-10-CM

## 2015-06-01 NOTE — Telephone Encounter (Signed)
fax'd order to [facility] with instructions to schedule with patient directly

## 2015-06-01 NOTE — Telephone Encounter (Signed)
Patient called reporting "last night she found a knot in her breast on the right side, pretty big, similar to what I felt that was stage II.  It's in a different place, not near the scarring.  I need bilateral because the left annual was due in August and it needs to focus more on the right side.  I need Dr. Lindi Adie to call and send an order for bilateral diagnostic imaging/Mammogram.  Melissa Weiss will be at [place], [phone] until 4:00 pm today.  Have Sherri call me at [phone] to schedule because I see patients too."

## 2015-06-14 ENCOUNTER — Other Ambulatory Visit: Payer: Self-pay | Admitting: *Deleted

## 2015-07-05 LAB — HM PAP SMEAR

## 2015-09-23 ENCOUNTER — Telehealth: Payer: Self-pay | Admitting: *Deleted

## 2015-09-23 NOTE — Telephone Encounter (Signed)
Pt called requesting lab order to be sent to [facility] for CBC, and Pregnancy Test.   Stated she did home pregnancy test and had positive results this am.  Pt also would like to inform Dr. 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  Her GYN would like to schedule pt for Colposcopy, but not scheduled yet pending blood draw results for pregnancy test.  Pt was referred to [facility] by her GYN for high risk pregnancy. Pt would like to be contacted when orders faxed to the lab so pt can schedule appt. Pt's   Phone    [phone].

## 2015-09-23 NOTE — Telephone Encounter (Signed)
Spoke with pt and instructed pt to contact her GYN provider to order for pregnancy test to be drawn at the same time with CBC ordered by this office - as per Karna Christmas, RN desk nurse.  Informed pt to ask for pregnancy test results to be faxed directly to her GYN for review, since pt will be going to [facility] for high risk clinic.  Pt voiced understanding.

## 2015-09-26 ENCOUNTER — Encounter: Payer: Self-pay | Admitting: Family Medicine

## 2015-09-26 ENCOUNTER — Ambulatory Visit (INDEPENDENT_AMBULATORY_CARE_PROVIDER_SITE_OTHER): Payer: No Typology Code available for payment source | Admitting: Family Medicine

## 2015-09-26 VITALS — BP 100/60 | HR 86 | Temp 98.7°F | Ht 65.0 in | Wt 133.5 lb

## 2015-09-26 DIAGNOSIS — C50411 Malignant neoplasm of upper-outer quadrant of right female breast: Secondary | ICD-10-CM

## 2015-09-26 DIAGNOSIS — N912 Amenorrhea, unspecified: Secondary | ICD-10-CM

## 2015-09-26 LAB — POCT URINE PREGNANCY: Preg Test, Ur: NEGATIVE

## 2015-09-26 LAB — HCG, QUANTITATIVE, PREGNANCY: hCG, Beta Chain, Quant, S: 103.2 m[IU]/mL — ABNORMAL HIGH

## 2015-09-26 NOTE — Progress Notes (Signed)
Pre visit review using our clinic review tool, if applicable. No additional management support is needed unless otherwise documented below in the visit note. 

## 2015-09-27 ENCOUNTER — Encounter: Payer: Self-pay | Admitting: Family Medicine

## 2015-09-27 ENCOUNTER — Telehealth: Payer: Self-pay

## 2015-09-27 NOTE — Telephone Encounter (Signed)
PLEASE NOTE: All timestamps contained within this report are represented as [place] Standard Time. CONFIDENTIALTY NOTICE: This fax transmission is intended only for the addressee. It contains information that is legally privileged, confidential or otherwise protected from use or disclosure. If you are not the intended recipient, you are strictly prohibited from reviewing, disclosing, copying using or disseminating any of this information or taking any action in reliance on or regarding this information. If you have received this fax in error, please notify us immediately by telephone so that we can arrange for its return to [place]. Phone: [phone], Toll-Free: [phone], Fax: [phone] Page: 1 of 2 Call Id: [id number] [place] Patient Name: Melissa Weiss Gender: Female DOB: [birth year] Age: 35 Y 9 M 22 D Return Phone Number: Address: City/State/Zip: [place] Client [place] Night - Client Client Site [place] [facility] [place], [place] Type Call Who Is Calling Lab Relationship To Patient Other Lab Name [facility] Lab Phone Number [phone] Lab Tech Name Janett Billow Lab Reference Number [id number] Chief Complaint Lab Result Call Type Lab Send to RN Reason for Call Report lab results Initial Comment Caller states she has stat lab to report to on call Additional Comment Translation No Nurse Assessment Nurse: Georgina Peer, RN, Kendrick Fries Date/Time Eilene Ghazi Time): 09/26/2015 9:40:18 PM Confirm and document reason for call. If symptomatic, describe symptoms. You must click the next button to save text entered. ---Caller has lab results on this patient for the Doctor. I will page the on-call. Has the patient traveled out of the country within the last 30 days? ---Not Applicable Does the patient have any new or worsening symptoms?  ---No Guidelines Guideline Title Affirmed Question Affirmed Notes Nurse Date/Time (Eastern Time) Disp. Time Eilene Ghazi Time) Disposition Final User 09/26/2015 9:43:09 PM Called On-Call Provider Georgina Peer, RN, Kendrick Fries 09/26/2015 9:45:05 PM Called On-Call Provider Georgina Peer, RN, Kendrick Fries Reason: Results reported to Dr Inda Merlin 09/26/2015 9:45:17 PM Clinical Call Yes Georgina Peer, RN, [facility] Phone DateTime Result/Outcome Message Type Notes Simonne Martinet [id number] 09/26/2015 9:43:09 PM Called On Call Provider - Reached Doctor Paged PLEASE NOTE: All timestamps contained within this report are represented as [place] Standard Time. CONFIDENTIALTY NOTICE: This fax transmission is intended only for the addressee. It contains information that is legally privileged, confidential or otherwise protected from use or disclosure. If you are not the intended recipient, you are strictly prohibited from reviewing, disclosing, copying using or disseminating any of this information or taking any action in reliance on or regarding this information. If you have received this fax in error, please notify us immediately by telephone so that we can arrange for its return to [place]. Phone: [phone], Toll-Free: [phone], Fax: [phone] Page: 2 of 2 Call Id: [id number] Paging DoctorName Phone DateTime Result/Outcome Message Type Notes Simonne Martinet 09/26/2015 9:44:34 PM Spoke with On Call - General Message Result Spoke with Dr. Burnice Logan regarding the lab result from [facility] labs on this patient: Hcg of 103.2 (high). No new orders at this time.

## 2015-09-27 NOTE — Telephone Encounter (Signed)
Dr Lorelei Pont has already this issue on lab result note 09/27/15.

## 2015-09-27 NOTE — Progress Notes (Signed)
Dr. Frederico Hamman T. Antonae Zbikowski, MD, [facility] [place], [address] Phone: [phone] Fax: [phone]  09/26/2015  Patient: Melissa Weiss, MRN: [id number], DOB: April 11, 1981, 35 y.o.  Primary Physician:  Owens Loffler, MD   Chief Complaint  Patient presents with  . Amenorrhea   Subjective:   Melissa Weiss is a 35 y.o. very pleasant female patient who presents with the following:  This is a former patient, technically new patient who has not been seen in 3-1/2 years in the office.  I was asked to see her emergently this afternoon by our office staff.  She has a history of breast cancer treated by lumpectomy 2.  LMP August 23, 2015.   She presents with potential pregnancy.  She has had no pain and no spotting at all.  She reports that she has had 6 positive pregnancy tests at home - the last one being Saturday.  Past Medical History, Surgical History, Social History, Family History, Problem List, Medications, and Allergies have been reviewed and updated if relevant.  Patient Active Problem List   Diagnosis Date Noted  . Lung nodule 04/26/2014  . Breast cancer of upper-outer quadrant of right female breast ([place]) 03/09/2013  . Cellulitis, face 01/28/2012  . LEG EDEMA 09/06/2010  . NEOPLASMS UNSPEC NATURE BONE SOFT TISSUE&SKIN 09/05/2010  . LIPOMA 09/04/2010  . SKIN RASH 02/03/2010  . EXPOSURE TO HAZARDOUS BODY FLUID, HX OF 11/23/2008  . ANEMIA-NOS 03/07/2007  . ANXIETY STATE NOS 03/07/2007  . COLITIS 03/07/2007  . 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 Anemia   . Colitis   . Other acne   . Anxiety state, unspecified   . Lipoma of unspecified site   . Neoplasm of unspecified nature of bone, soft tissue, and skin   . Personal history of contact with and (suspected) exposure to potentially hazardous body fluids   . Ovarian cyst 2000  . Adnexal mass 2000  . Colon polyp    benign  . H/O urticaria   . Folliculitis [id number]    pseudomonas  . Allergy   . S/P radiation therapy 04/28/2013-06/11/2013    Right breast / 45 Gray @ 1.8 [place] Cables per fraction x 25 fractions/Right breast boost / 16 Gray at [employer] per fraction x 8 fractions   . Cancer [facility])     Past Surgical History  Procedure Laterality Date  . Tonsillectomy  1998  . Colonoscopy  6/00    few polyps; bx neg  . Esophagogastroduodenoscopy  7/00    Neg  . Flexible sigmoidoscopy  3/01    Neg    Social History   Social History  . Marital Status: Married    Spouse Name: N/A  . Number of Children: 1  . Years of Education: N/A   Occupational History  . Not on file.   Social History Main Topics  . Smoking status: Never Smoker   . Smokeless tobacco: Never Used  . Alcohol Use: No  . Drug Use: No  . Sexual Activity: Not Currently   Other Topics Concern  . Not on file   Social History Narrative   Married; 1 child    No family history on file.  Allergies  Allergen Reactions  . Azithromycin     REACTION: nausea  . Codeine Nausea Only    REACTION: reaction not known  . Erythromycin     REACTION: nausea  .  Ferrous Sulfate     REACTION: nausea  . Lidocaine     REACTION: swelling in mouth  . Promethazine Hcl     REACTION: redness and swelling at site if injected  . Vicodin [Hydrocodone-Acetaminophen] Nausea And Vomiting  . Epinephrine Palpitations    Medication list reviewed and updated in full in [place].   GEN: No acute illnesses, no fevers, chills. GI: No n/v/d, eating normally Pulm: No SOB Interactive and getting along well at home.  Otherwise, ROS is as per the HPI.  Objective:   BP 100/60 mmHg  Pulse 86  Temp(Src) 98.7 F (37.1 C) (Oral)  Ht 5\' 5"  (1.651 m)  Wt 133 lb 8 oz (60.555 kg)  BMI 22.22 kg/m2  LMP 08/23/2015  GEN: WDWN, NAD, Non-toxic, A & O x 3 HEENT: Atraumatic, Normocephalic. Neck supple. No masses, No LAD. Ears and Nose: No external  deformity. EXTR: No c/c/e NEURO Normal gait.  PSYCH: Normally interactive. Conversant. Anxious, crying somewhat.   Laboratory and Imaging Data: Results for orders placed or performed in visit on 09/26/15  hCG, quantitative, pregnancy  Result Value Ref Range   hCG, Beta Chain, Quant, S 103.2 (H) mIU/mL  POCT urine pregnancy  Result Value Ref Range   Preg Test, Ur Negative Negative     Assessment and Plan:   Amenorrhea - Plan: POCT urine pregnancy, hCG, quantitative, pregnancy  Breast cancer of upper-outer quadrant of right female breast ([place])  Initial urine pregnancy test in the office was negative.  Quantitative hCG was inconclusive at 103 with an LMP of August 23, 2015.  Either she is very early on in her pregnancy, or she is in the middle of having a miscarriage.  All of this was discussed with the patient face-to-face and over the telephone after results have returned.  I have asked her to call her obstetrician this morning for follow-up today.  If that is impossible, then we may repeat a quantitative hCG at the direction of her obstetrician for timing.  Follow-up: as above  Orders Placed This Encounter  Procedures  . hCG, quantitative, pregnancy  . POCT urine pregnancy    Signed,  Kasem Mozer T. Tiffannie Sloss, MD   Patient's Medications  New Prescriptions   No medications on file  Previous Medications   No medications on file  Modified Medications   No medications on file  Discontinued Medications   TRANEXAMIC ACID (LYSTEDA) 650 MG TABS TABLET    Take by mouth.

## 2015-10-05 ENCOUNTER — Telehealth: Payer: Self-pay

## 2015-10-05 NOTE — Telephone Encounter (Signed)
Pt left v/m; pt had repeat hcg levels done on 10/03/15 and results 2582. FYI to Dr Lorelei Pont.

## 2015-10-05 NOTE — Telephone Encounter (Signed)
Please call her and let her know how happy I am about the good news. So glad to hear it and good luck!

## 2015-10-05 NOTE — Telephone Encounter (Signed)
Left message for Melissa Weiss that we are very happy to hear this news and best of luck with everything.

## 2015-11-17 DIAGNOSIS — R87811 Vaginal high risk human papillomavirus (HPV) DNA test positive: Secondary | ICD-10-CM | POA: Insufficient documentation

## 2015-11-21 ENCOUNTER — Encounter: Payer: Self-pay | Admitting: *Deleted

## 2015-11-21 NOTE — Progress Notes (Signed)
Received office notes from [facility], reviewed by Dr. Lindi Adie, sent to scan.

## 2016-05-22 LAB — HIV ANTIBODY (ROUTINE TESTING W REFLEX): HIV: NEGATIVE

## 2016-05-23 LAB — HIV ANTIBODY (ROUTINE TESTING W REFLEX): HIV 1&2 Ab, 4th Generation: NEGATIVE

## 2016-11-22 ENCOUNTER — Encounter: Payer: Self-pay | Admitting: Family Medicine

## 2016-11-22 ENCOUNTER — Ambulatory Visit (INDEPENDENT_AMBULATORY_CARE_PROVIDER_SITE_OTHER): Payer: No Typology Code available for payment source | Admitting: Family Medicine

## 2016-11-22 ENCOUNTER — Encounter: Payer: Self-pay | Admitting: *Deleted

## 2016-11-22 DIAGNOSIS — N6311 Unspecified lump in the right breast, upper outer quadrant: Secondary | ICD-10-CM | POA: Insufficient documentation

## 2016-11-22 DIAGNOSIS — C50411 Malignant neoplasm of upper-outer quadrant of right female breast: Secondary | ICD-10-CM

## 2016-11-22 DIAGNOSIS — L659 Nonscarring hair loss, unspecified: Secondary | ICD-10-CM | POA: Diagnosis not present

## 2016-11-22 DIAGNOSIS — Z170 Estrogen receptor positive status [ER+]: Secondary | ICD-10-CM

## 2016-11-22 LAB — CBC WITH DIFFERENTIAL/PLATELET
BASOS ABS: 0 10*3/uL (ref 0.0–0.1)
Basophils Relative: 0.4 % (ref 0.0–3.0)
EOS ABS: 0.1 10*3/uL (ref 0.0–0.7)
Eosinophils Relative: 1.4 % (ref 0.0–5.0)
HEMATOCRIT: 33.4 % — AB (ref 36.0–46.0)
Hemoglobin: 10.9 g/dL — ABNORMAL LOW (ref 12.0–15.0)
LYMPHS PCT: 22.3 % (ref 12.0–46.0)
Lymphs Abs: 1 10*3/uL (ref 0.7–4.0)
MCHC: 32.5 g/dL (ref 30.0–36.0)
MCV: 73.4 fl — AB (ref 78.0–100.0)
MONOS PCT: 10.3 % (ref 3.0–12.0)
Monocytes Absolute: 0.4 10*3/uL (ref 0.1–1.0)
Neutro Abs: 2.9 10*3/uL (ref 1.4–7.7)
Neutrophils Relative %: 65.6 % (ref 43.0–77.0)
PLATELETS: 224 10*3/uL (ref 150.0–400.0)
RBC: 4.56 Mil/uL (ref 3.87–5.11)
RDW: 19.3 % — ABNORMAL HIGH (ref 11.5–15.5)
WBC: 4.3 10*3/uL (ref 4.0–10.5)

## 2016-11-22 LAB — T3, FREE: T3, Free: 3.4 pg/mL (ref 2.3–4.2)

## 2016-11-22 LAB — IBC PANEL
Iron: 15 ug/dL — ABNORMAL LOW (ref 42–145)
SATURATION RATIOS: 3 % — AB (ref 20.0–50.0)
TRANSFERRIN: 361 mg/dL — AB (ref 212.0–360.0)

## 2016-11-22 LAB — T4, FREE: Free T4: 0.86 ng/dL (ref 0.60–1.60)

## 2016-11-22 LAB — FERRITIN: FERRITIN: 2.6 ng/mL — AB (ref 10.0–291.0)

## 2016-11-22 LAB — TSH: TSH: 0.66 u[IU]/mL (ref 0.35–4.50)

## 2016-11-22 NOTE — Progress Notes (Signed)
Pre visit review using our clinic review tool, if applicable. No additional management support is needed unless otherwise documented below in the visit note. 

## 2016-11-22 NOTE — Assessment & Plan Note (Signed)
Will eval with labs. Most likely due to stress of devlivery/pregnanacy.

## 2016-11-22 NOTE — Addendum Note (Signed)
Addended by: Carter Kitten on: 11/22/2016 12:52 PM   Modules accepted: Orders

## 2016-11-22 NOTE — Addendum Note (Signed)
Addended by: Carter Kitten on: 11/22/2016 03:58 PM   Modules accepted: Orders

## 2016-11-22 NOTE — Progress Notes (Signed)
Subjective:    Patient ID: Melissa Weiss, female    DOB: 1981/05/29, 36 y.o.   MRN: [id number]  HPI   36 year old female patient of Dr. Lillie Fragmin with history of anxiety presents with new onset alopecia.   She has noted in last 3-4 months diffuse hair loss, hair thinning all over. Maybe most in bilateral temples.  No skin change.   She delivered a baby on 05/2106 Has been breast feeding since delivery.  She has quit nursing in last 2 weeks.  She has been taking prenatal vitamin.  She is also taking biotin.  She has been tired lately, but does have job and baby. No SOB, no CP.  Hx of breast cancer and  iron def anemia. Had iron infusion prior to the delivery.  No hair loss in family.   Hx of breast caner in right breast, s/p lumpectomy, radiation on right.  In last several months while breast feeding noted mass in right breast.  Over due for mammogram and [place].  Needs referral to get back to see oncologist Dr. Lindi Adie.  Blood pressure 98/68, pulse 79, temperature 97.4 F (36.3 C), temperature source Oral, height 5\' 3"  (1.6 m), weight 139 lb 8 oz (63.3 kg), last menstrual period 11/04/2016.  Review of Systems  Constitutional: Positive for fatigue. Negative for fever.  HENT: Negative for ear pain.   Eyes: Negative for pain.  Respiratory: Negative for chest tightness and shortness of breath.   Cardiovascular: Negative for chest pain, palpitations and leg swelling.  Gastrointestinal: Negative for abdominal pain.  Genitourinary: Negative for dysuria.       Objective:   Physical Exam  Constitutional: Vital signs are normal. She appears well-developed and well-nourished. She is cooperative.  Non-toxic appearance. She does not appear ill. No distress.  HENT:  Head: Normocephalic.  Right Ear: Hearing, tympanic membrane, external ear and ear canal normal. Tympanic membrane is not erythematous, not retracted and not bulging.  Left Ear: Hearing, tympanic membrane, external ear  and ear canal normal. Tympanic membrane is not erythematous, not retracted and not bulging.  Nose: No mucosal edema or rhinorrhea. Right sinus exhibits no maxillary sinus tenderness and no frontal sinus tenderness. Left sinus exhibits no maxillary sinus tenderness and no frontal sinus tenderness.  Mouth/Throat: Uvula is midline, oropharynx is clear and moist and mucous membranes are normal.  Eyes: Conjunctivae, EOM and lids are normal. Pupils are equal, round, and reactive to light. Lids are everted and swept, no foreign bodies found.  Neck: Trachea normal and normal range of motion. Neck supple. Carotid bruit is not present. No thyroid mass and no thyromegaly present.  Cardiovascular: Normal rate, regular rhythm, S1 normal, S2 normal, normal heart sounds, intact distal pulses and normal pulses.  Exam reveals no gallop and no friction rub.   No murmur heard. Pulmonary/Chest: Effort normal and breath sounds normal. No tachypnea. No respiratory distress. She has no decreased breath sounds. She has no wheezes. She has no rhonchi. She has no rales.  Abdominal: Soft. Normal appearance and bowel sounds are normal. There is no tenderness.  Genitourinary: No breast swelling, tenderness, discharge or bleeding.  Genitourinary Comments: Right breast.. Healed scar, at 9 oclock above scar deep, 3 x 2 cm mass , non mobile, non-tender.  Neurological: She is alert.  Skin: Skin is warm, dry and intact. No rash noted.  Diffuse hair loss at temple, thinning all over  new hair grown at ttemples  nml hair No rash on scalp  Psychiatric: Her speech is normal and behavior is normal. Judgment and thought content normal. Her mood appears not anxious. Cognition and memory are normal. She does not exhibit a depressed mood.          Assessment & Plan:

## 2016-11-22 NOTE — Patient Instructions (Signed)
Please stop at the lab to set up to have labs drawn. Please stop at the front desk to set up referral.

## 2016-11-22 NOTE — Assessment & Plan Note (Signed)
Possible related to breast feeding an calcification, but concerning given pt history. Refer for diagnostic mamm and [place] ASAP.

## 2016-11-22 NOTE — Assessment & Plan Note (Signed)
Needs to return to oncologist to be followed now pregnancy and lactation completed

## 2016-11-27 ENCOUNTER — Telehealth: Payer: Self-pay | Admitting: Hematology and Oncology

## 2016-11-27 NOTE — Telephone Encounter (Signed)
sw pt to confirm 4/25 appt at 315 pm per staff message

## 2016-11-28 ENCOUNTER — Ambulatory Visit: Payer: BLUE CROSS/BLUE SHIELD | Admitting: Hematology and Oncology

## 2016-12-03 ENCOUNTER — Encounter: Payer: Self-pay | Admitting: Family Medicine

## 2016-12-04 ENCOUNTER — Telehealth: Payer: Self-pay | Admitting: Family Medicine

## 2016-12-04 NOTE — Telephone Encounter (Signed)
Thank you for your help.

## 2016-12-04 NOTE — Telephone Encounter (Signed)
Patient was seen by Dr Diona Browner and Dr Diona Browner ordered diagnostic Mammograms and [place] for patient to be done at [facility]. We also made her a followup with her Oncologist Dr Lindi Adie on 11/28/16. Patient says they have told her it looks like she now has cancer in her left breast and are doing a needle biopsy tomorrow, 12/05/16. She cancelled the Oncology appt b/c she hadnt had the mammogram done before the appt. She will call them today and get rescheduled with Dr Lindi Adie. She wanted you both to be aware. Results will be coming to Dr Diona Browner from [place].

## 2016-12-05 IMAGING — CT CT FEMUR *R* W/ CM
3 series · 16 of 33 positions shown, 19 images · IV contrast (agent unspecified)
Comparison: None.

CONTRAST:  100 mL OMNIPAQUE IOHEXOL 300 MG/ML  SOLN

CLINICAL DATA: History of breast cancer diagnosed January 2013.
Status post lumpectomy and radiation therapy. The patient has two
knots on the right thigh which have been present for the past few
years but have enlarged. Initial encounter.

EXAM:
CT OF THE LOWER RIGHT EXTREMITY WITH CONTRAST
TECHNIQUE: Multidetector CT imaging of the right upper leg was performed
according to the standard protocol following intravenous contrast
administration.

[Series 4: thins (person_name) recon · axial · 0.62mm/px · z∈[-550,-154]mm · 8 of 156 slices shown, 10 images]
[im 12/156  soft-tissue]
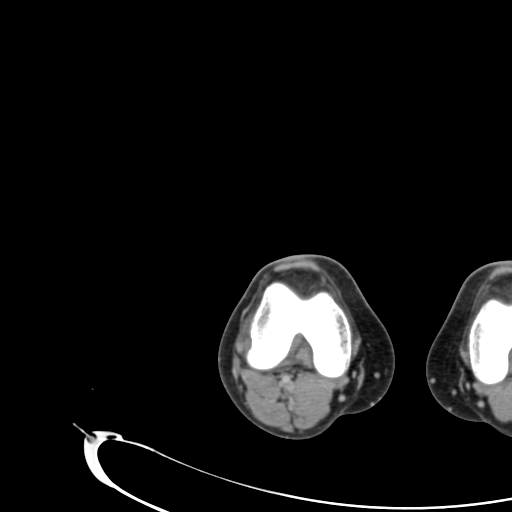
[im 12/156  bone]
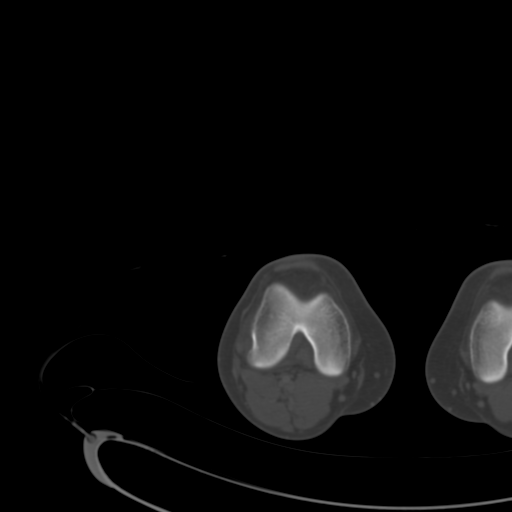
[im 36/156  bone]
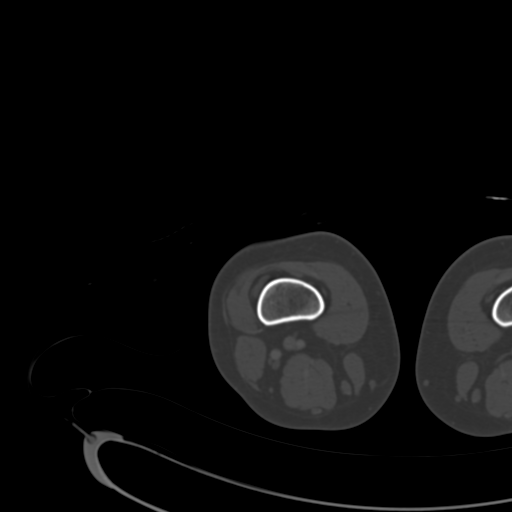
[im 48/156  bone]
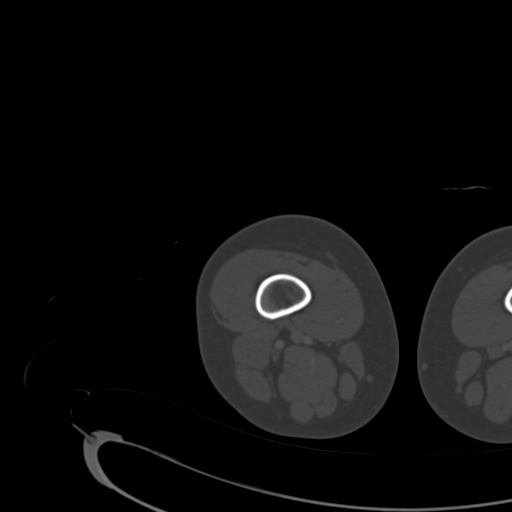
[im 72/156  bone]
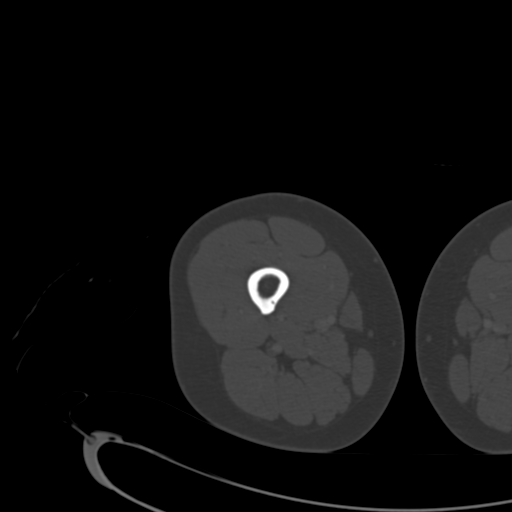
[im 84/156  soft-tissue]
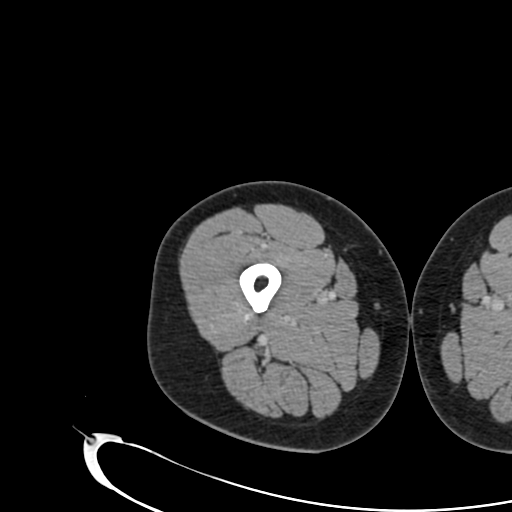
[im 84/156  bone]
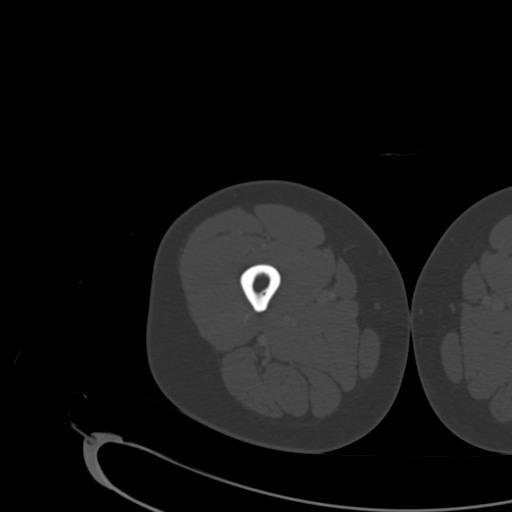
[im 108/156  bone]
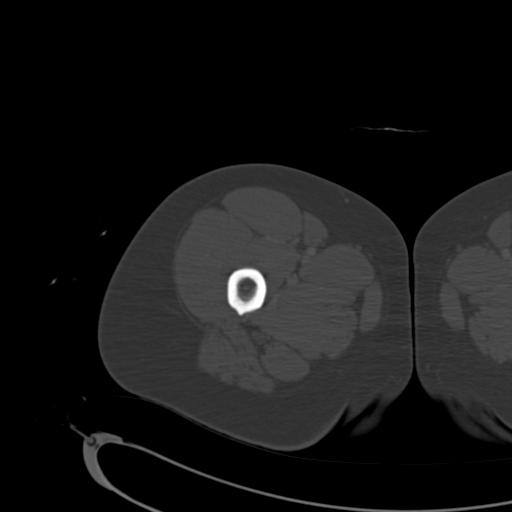
[im 120/156  bone]
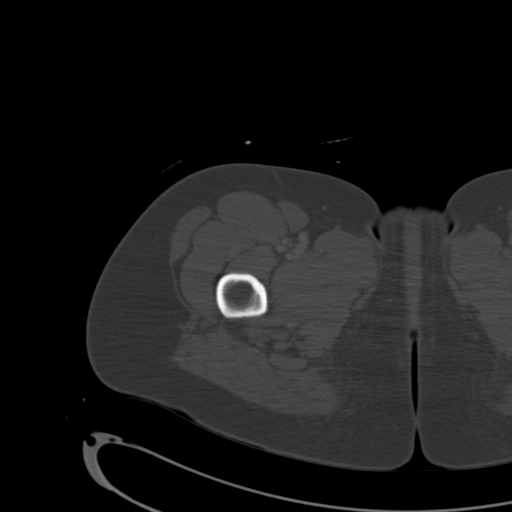
[im 144/156  bone]
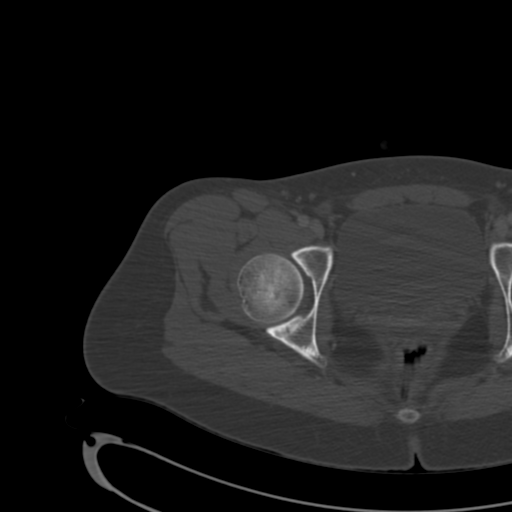

[Series 602: st cor · coronal · 0.91mm/px · 3 of 101 slices shown]
[im 21/101  bone]
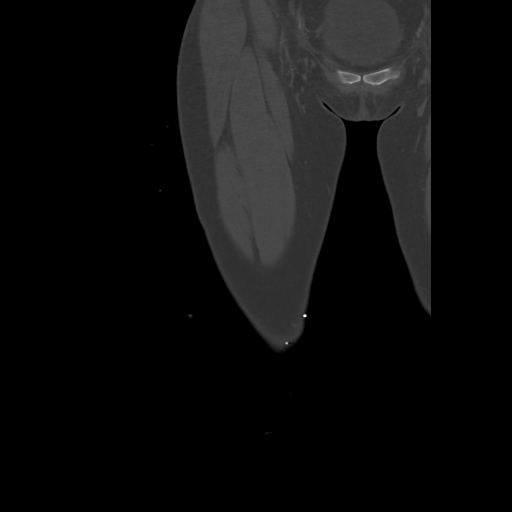
[im 41/101  bone]
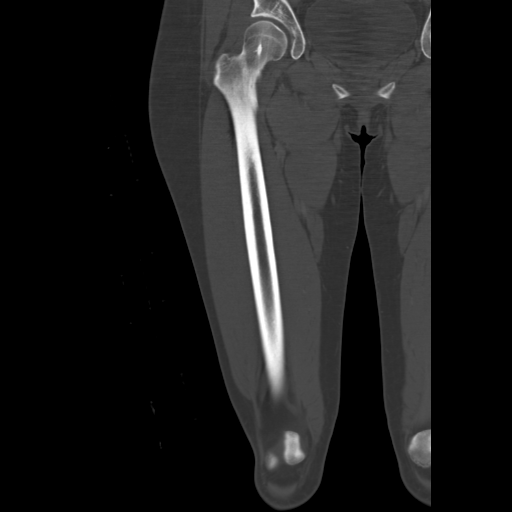
[im 61/101  bone]
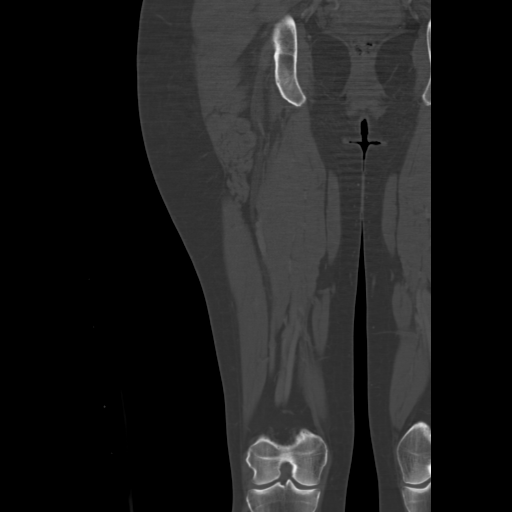

[Series 603: st sag · sagittal · 0.91mm/px · 5 of 109 slices shown, 6 images]
[im 37/109  bone]
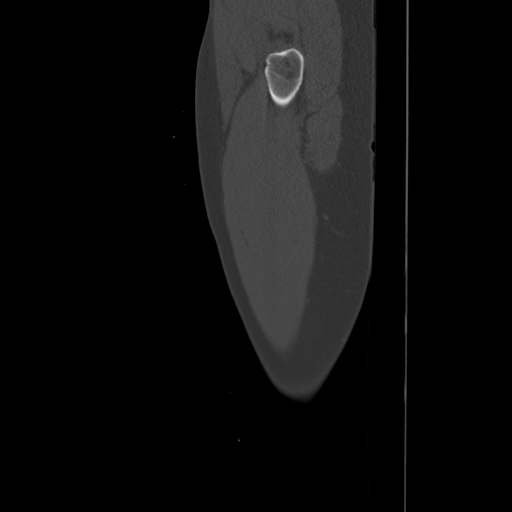
[im 46/109  bone]
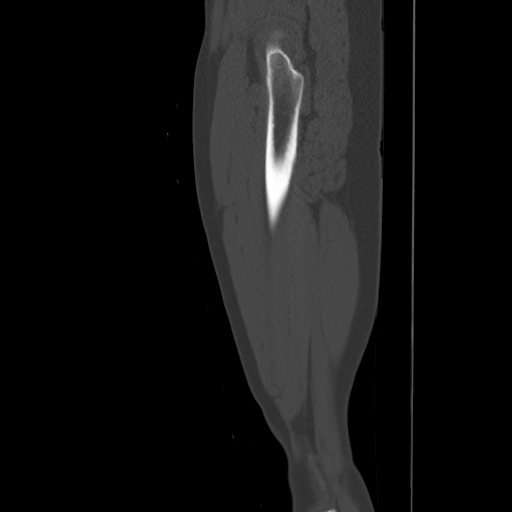
[im 55/109  soft-tissue]
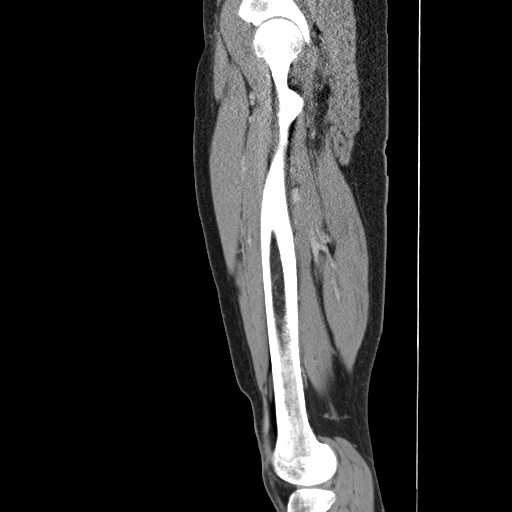
[im 55/109  bone]
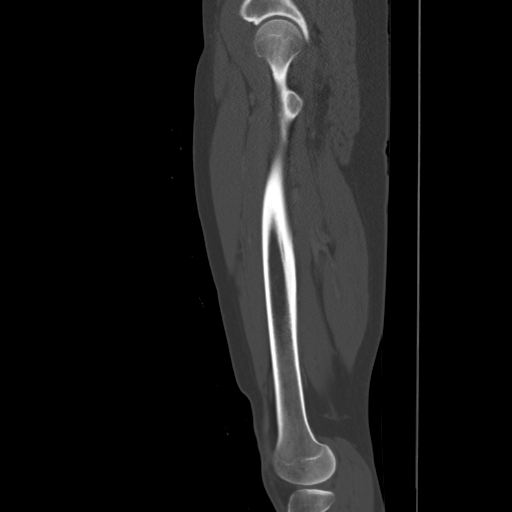
[im 64/109  bone]
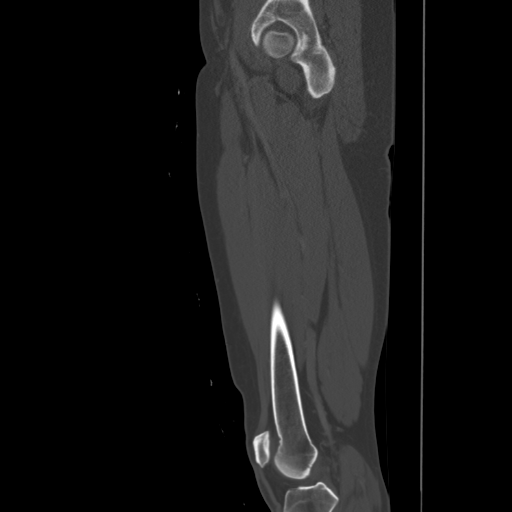
[im 73/109  bone]
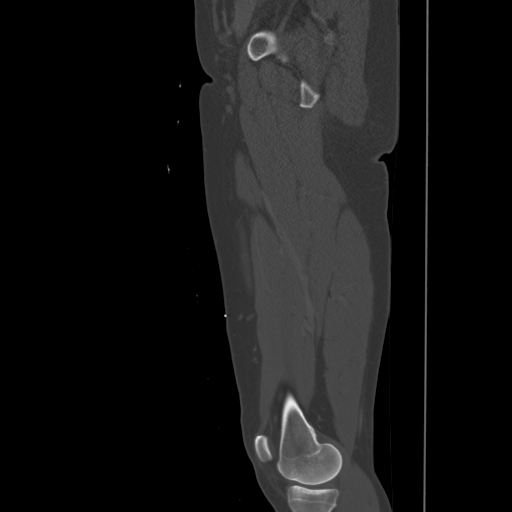

[16 of 33 positions shown; findings below may reference images not displayed]

FINDINGS: Two markers are placed in the region of concern along the
anteromedial aspect of the right upper leg. Small varicosities are
seen deep to the markers. No mass or fluid collection is identified.
All imaged musculature has a normal appearance. No bony abnormality
is identified.
IMPRESSION: Small varicosities in the region of concern account for the
patient's palpated abnormality. No mass is identified.

## 2020-04-04 ENCOUNTER — Other Ambulatory Visit: Payer: Self-pay

## 2020-04-04 ENCOUNTER — Encounter: Payer: Self-pay | Admitting: Family Medicine

## 2020-04-04 ENCOUNTER — Ambulatory Visit: Admit: 2020-04-04 | Disposition: A | Discharge status: Home / Self Care | Payer: BLUE CROSS/BLUE SHIELD

## 2020-04-04 ENCOUNTER — Encounter: Payer: Self-pay | Admitting: Emergency Medicine

## 2020-04-04 ENCOUNTER — Emergency Department: Payer: Managed Care, Other (non HMO)

## 2020-04-04 DIAGNOSIS — D492 Neoplasm of unspecified behavior of bone, soft tissue, and skin: Secondary | ICD-10-CM | POA: Diagnosis not present

## 2020-04-04 DIAGNOSIS — R0789 Other chest pain: Secondary | ICD-10-CM | POA: Insufficient documentation

## 2020-04-04 DIAGNOSIS — Z20822 Contact with and (suspected) exposure to covid-19: Secondary | ICD-10-CM | POA: Diagnosis not present

## 2020-04-04 DIAGNOSIS — C50411 Malignant neoplasm of upper-outer quadrant of right female breast: Secondary | ICD-10-CM | POA: Diagnosis not present

## 2020-04-04 DIAGNOSIS — Z79899 Other long term (current) drug therapy: Secondary | ICD-10-CM | POA: Diagnosis not present

## 2020-04-04 DIAGNOSIS — R079 Chest pain, unspecified: Secondary | ICD-10-CM | POA: Diagnosis present

## 2020-04-04 DIAGNOSIS — J40 Bronchitis, not specified as acute or chronic: Secondary | ICD-10-CM | POA: Insufficient documentation

## 2020-04-04 LAB — COMPREHENSIVE METABOLIC PANEL
ALT: 16 U/L (ref 0–44)
AST: 20 U/L (ref 15–41)
Albumin: 4 g/dL (ref 3.5–5.0)
Alkaline Phosphatase: 66 U/L (ref 38–126)
Anion gap: 9 (ref 5–15)
BUN: 9 mg/dL (ref 6–20)
CO2: 24 mmol/L (ref 22–32)
Calcium: 8.7 mg/dL — ABNORMAL LOW (ref 8.9–10.3)
Chloride: 106 mmol/L (ref 98–111)
Creatinine, Ser: 0.65 mg/dL (ref 0.44–1.00)
GFR calc Af Amer: >60 mL/min (ref >60)
GFR calc non Af Amer: >60 mL/min (ref >60)
Glucose, Bld: 105 mg/dL — ABNORMAL HIGH (ref 70–99)
Potassium: 3.7 mmol/L (ref 3.5–5.1)
Sodium: 139 mmol/L (ref 135–145)
Total Bilirubin: 0.7 mg/dL (ref 0.3–1.2)
Total Protein: 7.5 g/dL (ref 6.5–8.1)

## 2020-04-04 LAB — CBC
HCT: 39.1 % (ref 36.0–46.0)
Hemoglobin: 13.4 g/dL (ref 12.0–15.0)
MCH: 30.5 pg (ref 26.0–34.0)
MCHC: 34.3 g/dL (ref 30.0–36.0)
MCV: 89.1 fL (ref 80.0–100.0)
Platelets: 205 10*3/uL (ref 150–400)
RBC: 4.39 MIL/uL (ref 3.87–5.11)
RDW: 13.2 % (ref 11.5–15.5)
WBC: 6.1 10*3/uL (ref 4.0–10.5)
nRBC: 0 % (ref 0.0–0.2)

## 2020-04-04 LAB — TROPONIN I (HIGH SENSITIVITY): Troponin I (High Sensitivity): 4 ng/L (ref <18)

## 2020-04-04 NOTE — ED Notes (Signed)
First Nurse Note:  Pt in via [facility] from [facility]; per EMS, pt has been on Amoxicillin x 3 days for URI, complaints of ongoing weakness, fever, shortness of breath, body aches.  Reports negative Covid test x 2.  Vitals per EMS: Temp 99.4, BP 138/72, HR 130, SpO2 100% RA  20G PIV to left wrist upon arrival, approximately 159mL NaCl given prior to arrival.  CBG 99.

## 2020-04-04 NOTE — ED Triage Notes (Signed)
Pt here for chest pain and feeling out of breath. Reports used the home covid test and it was negative.  Started amoxicillin after tele visit with MD for URI.  Unlabored at this time, although does appear to feel bad.  Feels like feet are cold.  Both feet cool to touch but DP WNL.  No discoloration to feet.

## 2020-04-05 ENCOUNTER — Emergency Department
Admission: EM | Admit: 2020-04-05 | Discharge: 2020-04-05 | Disposition: A | Discharge status: Home / Self Care | Payer: Managed Care, Other (non HMO) | Attending: Emergency Medicine | Admitting: Emergency Medicine

## 2020-04-05 DIAGNOSIS — R0789 Other chest pain: Secondary | ICD-10-CM

## 2020-04-05 DIAGNOSIS — J40 Bronchitis, not specified as acute or chronic: Secondary | ICD-10-CM

## 2020-04-05 LAB — POCT PREGNANCY, URINE: Preg Test, Ur: NEGATIVE

## 2020-04-05 LAB — FIBRIN DERIVATIVES D-DIMER (ARMC ONLY): Fibrin derivatives D-dimer (ARMC): 220.18 ng/mL (FEU) (ref 0.00–499.00)

## 2020-04-05 LAB — TROPONIN I (HIGH SENSITIVITY): Troponin I (High Sensitivity): 3 ng/L (ref <18)

## 2020-04-05 LAB — SARS CORONAVIRUS 2 BY RT PCR (HOSPITAL ORDER, PERFORMED IN ~~LOC~~ HOSPITAL LAB): SARS Coronavirus 2: NEGATIVE

## 2020-04-05 MED ORDER — ALBUTEROL SULFATE HFA 108 (90 BASE) MCG/ACT IN AERS: 2.0000 | INHALATION_SPRAY | Freq: Four times a day (QID) | RESPIRATORY_TRACT | 0 refills | Status: DC | PRN

## 2020-04-05 MED ORDER — ALBUTEROL SULFATE (2.5 MG/3ML) 0.083% IN NEBU: 3.0000 mL | INHALATION_SOLUTION | Freq: Once | RESPIRATORY_TRACT | Status: DC

## 2020-04-05 NOTE — ED Notes (Signed)
Dr. Charna Archer at the bedside to discuss pt plan of care.

## 2020-04-05 NOTE — ED Provider Notes (Signed)
[facility] Emergency Department Provider Note   ____________________________________________   First MD Initiated Contact with Patient 04/05/20 [phone]     (approximate)  I have reviewed the triage vital signs and the nursing notes.   HISTORY  Chief Complaint Chest Pain    HPI Melissa Weiss is a 39 y.o. female with no significant past medical history who presents to the ED complaining of chest pain.  Patient reports that she has had tightness in her chest for the past for 5 days.  This is associated with subjective fevers and chills along with feeling like she cannot quite take a full breath.  She has had some diarrhea but denies any nausea, vomiting, abdominal pain, dysuria, or hematuria.  She denies any sick contacts but is not fully vaccinated against COVID-19.  She reports taking a home test for Covid which was negative, subsequently spoke with tele-MD and was prescribed amoxicillin for URI.        Past Medical History:  Diagnosis Date  . Anxiety state, unspecified   . Breast cancer of upper-outer quadrant of right female breast ([place]) 03/09/2013  . Colitis   . Colon polyp    benign  . Folliculitis 3/41   pseudomonas  . H/O urticaria   . Lipoma of unspecified site   . Neoplasm of unspecified nature of bone, soft tissue, and skin   . Ovarian cyst 2000  . S/P radiation therapy 04/28/2013-06/11/2013   Right breast / 45 Gray @ 1.8 Pearline Cables per fraction x 25 fractions/Right breast boost / 16 Gray at [employer] per fraction x 8 fractions     Patient Active Problem List   Diagnosis Date Noted  . Mass of upper outer quadrant of right breast 11/22/2016  . Alopecia 11/22/2016  . 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 Lung nodule 04/26/2014  . Breast cancer of upper-outer quadrant of right female breast ([place]) 03/09/2013  . NEOPLASMS UNSPEC NATURE BONE SOFT TISSUE&SKIN 09/05/2010  . LIPOMA 09/04/2010  . ANXIETY STATE NOS 03/07/2007  . COLITIS  03/07/2007  . 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 BREAST LUMPECTOMY Bilateral   . COLONOSCOPY  6/00   few polyps; bx neg  . ESOPHAGOGASTRODUODENOSCOPY  7/00   Neg  . FLEXIBLE SIGMOIDOSCOPY  3/01   Neg  . TONSILLECTOMY  1998    Prior to Admission medications   Medication Sig Start Date End Date Taking? Authorizing Provider  albuterol (VENTOLIN HFA) 108 (90 Base) MCG/ACT inhaler Inhale 2 puffs into the lungs every 6 (six) hours as needed for wheezing or shortness of breath. 04/05/20   Blake Divine, MD  Biotin 2500 MCG CAPS Take 1 each by mouth daily. Gummies    [provider]  Prenatal MV-Min-FA-Omega-3 (PRENATAL GUMMIES/DHA & FA) 0.4-32.5 MG CHEW Chew 1 each by mouth daily.    [provider]    Allergies Azithromycin, Codeine, Erythromycin, Ferrous sulfate, Lidocaine, Promethazine hcl, Vicodin [hydrocodone-acetaminophen], and Epinephrine  History reviewed. No pertinent family history.  Social History Social History   Tobacco Use  . Smoking status: Never Smoker  . Smokeless tobacco: Never Used  Substance Use Topics  . Alcohol use: No    Alcohol/week: 0.0 standard drinks  . Drug use: No    Review of Systems  Constitutional: Positive for subjective fever/chills Eyes: No visual changes. ENT: No sore throat. Cardiovascular: Positive for chest pain. Respiratory: Positive for cough and shortness of breath.  Gastrointestinal: No abdominal pain.  No nausea, no vomiting.  Positive for diarrhea.  No constipation. Genitourinary: Negative for dysuria. Musculoskeletal: Negative for back pain. Skin: Negative for rash. Neurological: Negative for headaches, focal weakness or numbness.  ____________________________________________   PHYSICAL EXAM:  VITAL SIGNS: ED Triage Vitals  Enc Vitals Group     BP 04/04/20 1736 125/90     Pulse Rate 04/04/20 1736 (!) 116     Resp 04/04/20 1736 18     Temp 04/04/20 1736  98.7 F (37.1 C)     Temp Source 04/04/20 1736 Oral     SpO2 04/04/20 1736 98 %     Weight 04/04/20 1733 137 lb (62.1 kg)     Height 04/04/20 1733 5\' 3"  (1.6 m)     Head Circumference --      Peak Flow --      Pain Score 04/04/20 1732 10     Pain Loc --      Pain Edu? --      Excl. in [place]? --     Constitutional: Alert and oriented. Eyes: Conjunctivae are normal. Head: Atraumatic. Nose: No congestion/rhinnorhea. Mouth/Throat: Mucous membranes are moist. Neck: Normal ROM Cardiovascular: Normal rate, regular rhythm. Grossly normal heart sounds. Respiratory: Normal respiratory effort.  No retractions. Lungs CTAB. Gastrointestinal: Soft and nontender. No distention. Genitourinary: deferred Musculoskeletal: No lower extremity tenderness nor edema. Neurologic:  Normal speech and language. No gross focal neurologic deficits are appreciated. Skin:  Skin is warm, dry and intact. No rash noted. Psychiatric: Mood and affect are normal. Speech and behavior are normal.  ____________________________________________   LABS (all labs ordered are listed, but only abnormal results are displayed)  Labs Reviewed  COMPREHENSIVE METABOLIC PANEL - Abnormal; Notable for the following components:      Result Value   Glucose, Bld 105 (*)    Calcium 8.7 (*)    All other components within normal limits  SARS CORONAVIRUS 2 BY RT PCR (HOSPITAL ORDER, [facility])  CBC  FIBRIN DERIVATIVES D-DIMER ([place])  POC URINE PREG, ED  POCT PREGNANCY, URINE  TROPONIN I (HIGH SENSITIVITY)  TROPONIN I (HIGH SENSITIVITY)   ____________________________________________  EKG  ED ECG REPORT I, Blake Divine, the attending physician, personally viewed and interpreted this ECG.   Date: 04/05/2020  EKG Time: 17:35  Rate: 116  Rhythm: sinus tachycardia  Axis: Normal  Intervals:none  ST&T Change: None   PROCEDURES  Procedure(s) performed (including Critical  Care):  Procedures   ____________________________________________   INITIAL IMPRESSION / ASSESSMENT AND PLAN / ED COURSE       39 year old female with no significant past medical history presents to the ED complaining of approximately 5 days of cough, chest tightness, shortness of breath, subjective fevers and chills, and diarrhea.  Symptoms sound most consistent with viral illness and I would have a high suspicion for COVID-19 in this unvaccinated patient.  We will perform testing for COVID-19 and treat symptomatically with albuterol.  Work-up thus far has been unremarkable, EKG shows no evidence of arrhythmia or ischemia and 2 sets of troponin are negative.  Given cough as well as GI symptoms, I have a low suspicion for PE, patient has no apparent risk factors for PE.  Urine pregnancy pending at this time.  COVID-19 testing is negative.  Given her initial tachycardia, will rule out PE with D-dimer.  Patient is low risk by [place] and if D-dimer is negative I would doubt PE.  Patient turned  over to oncoming provider pending D-dimer results and if there is no evidence of PE she would be appropriate for discharge home with prescription for albuterol.      ____________________________________________   FINAL CLINICAL IMPRESSION(S) / ED DIAGNOSES  Final diagnoses:  Atypical chest pain  Bronchitis     ED Discharge Orders         Ordered    albuterol (VENTOLIN HFA) 108 (90 Base) MCG/ACT inhaler  Every 6 hours PRN       Note to Pharmacy: Please supply with spacer   04/05/20 0728           Note:  This document was prepared using Dragon voice recognition software and may include unintentional dictation errors.   Blake Divine, MD 04/05/20 [phone]

## 2020-04-05 NOTE — Discharge Instructions (Addendum)
Please follow-up with your doctor.  Please return if you are worse at all.  This includes worse pain, shortness of breath fever or feeling sicker.

## 2020-04-05 NOTE — ED Notes (Signed)
ED MD at the bedside for pt evaluation

## 2020-05-31 ENCOUNTER — Telehealth: Payer: Self-pay | Admitting: Family Medicine

## 2020-05-31 NOTE — Telephone Encounter (Signed)
Pt called to schedule appointment for pregnancy test labs.   Last time pt was here 11/22/16 with dr Diona Browner  09/26/15 with dr Oretha Caprice to schedule appointment with dr copland

## 2020-06-01 NOTE — Telephone Encounter (Signed)
That is ok 

## 2020-06-01 NOTE — Telephone Encounter (Signed)
Left message asking pt to call office  °

## 2020-06-02 NOTE — Telephone Encounter (Signed)
Pt called back she is going to call back to schedule she has to look at her schedule.

## 2020-08-06 HISTORY — PX: MASTECTOMY: SHX3

## 2020-08-19 ENCOUNTER — Telehealth: Payer: Self-pay

## 2020-08-19 ENCOUNTER — Other Ambulatory Visit (INDEPENDENT_AMBULATORY_CARE_PROVIDER_SITE_OTHER): Payer: Managed Care, Other (non HMO)

## 2020-08-19 ENCOUNTER — Telehealth (INDEPENDENT_AMBULATORY_CARE_PROVIDER_SITE_OTHER): Payer: Managed Care, Other (non HMO) | Admitting: Family Medicine

## 2020-08-19 DIAGNOSIS — J029 Acute pharyngitis, unspecified: Secondary | ICD-10-CM | POA: Diagnosis not present

## 2020-08-19 DIAGNOSIS — R509 Fever, unspecified: Secondary | ICD-10-CM | POA: Diagnosis not present

## 2020-08-19 DIAGNOSIS — R6889 Other general symptoms and signs: Secondary | ICD-10-CM

## 2020-08-19 LAB — POC INFLUENZA A&B (BINAX/QUICKVUE)
Influenza A, POC: NEGATIVE
Influenza B, POC: NEGATIVE

## 2020-08-19 LAB — POCT RAPID STREP A (OFFICE): Rapid Strep A Screen: NEGATIVE

## 2020-08-19 MED ORDER — AMOXICILLIN 500 MG PO CAPS: 500.0000 mg | ORAL_CAPSULE | Freq: Two times a day (BID) | ORAL | 0 refills | Status: DC

## 2020-08-19 NOTE — Patient Instructions (Addendum)
   ---------------------------------------------------------------------------------------------------------------------------      WORK SLIP:  Patient Melissa Weiss,  08-13-1980, was seen for a medical visit today, 08/19/20 . Please excuse from work for a COVID like illness. We advise 10 days minimum from the onset of symptoms (08/17/20) PLUS 1 day of no fever and improved symptoms. Will defer to employer for a sooner return to work if symptoms have resolved, it is greater than 5 days since the positive test and the patient can wear a high-quality, tight fitting mask such as N95 or KN95 at all times for an additional 5 days. Would also suggest COVID19 antigen testing is negative prior to return.  Sincerely: E-signature: Dr. Colin Benton, DO [place] Ph: [phone]   ------------------------------------------------------------------------------------------------------------------------------  HOME CARE TIPS:  -[place] testing information: https://www.[facility].org/ OR [phone] Most pharmacies also offer testing and home test kits.  -I sent the medication(s) we discussed to your pharmacy: Meds ordered this encounter  Medications  . amoxicillin (AMOXIL) 500 MG capsule    Sig: Take 1 capsule (500 mg total) by mouth 2 (two) times daily.    Dispense:  20 capsule    Refill:  0    -your test results can be found in Mychart, or you can contact your primary care office.  -can use tylenol or aleve if needed for fevers, aches and pains per instructions  -can use nasal saline a few times per day if nasal congestion, sometime a short course of Afrin nasal spray for 3 days can help as well  -stay hydrated, drink plenty of fluids and eat small healthy meals - avoid dairy   -If the Covid test is positive, follow up with your primary care doctor or schedule virtual visit follow up to address. Check out the CDC website for  more information on home care, transmission and treatment for COVID19  -follow up with your doctor in 2-3 days unless improving and feeling better  -stay home while sick, except to seek medical care, and if you have [place] please stay home for a full 10 days since the onset of symptoms PLUS one day of no fever and feeling better.  It was nice to meet you today, and I really hope you are feeling better soon. I help [place] out with telemedicine visits on Tuesdays and Thursdays and am available for visits on those days. If you have any concerns or questions following this visit please schedule a follow up visit with your Primary Care doctor or seek care at a local urgent care clinic to avoid delays in care.    Seek in person care promptly if your symptoms worsen, new concerns arise or you are not improving with treatment. Call 911 and/or seek emergency care if you symptoms are severe or life threatening.

## 2020-08-19 NOTE — Telephone Encounter (Signed)
Access Nurse transferred pt to this nurse. Pt c/o fever 103, sore throat with white spots, lethargic and diarrhea since last night. Pt was recently diagnosed with breast cancer for the second time and is to have surgery in 3 wks. . Pt denies any other symptoms. Advised pt she should have VV today. Pt is scheduled with Dr. Maudie Mercury today at [place]. Pt agreed. Scheduled with another [facility] practice. Advised to contact this office if any problems. Pt verbalized understanding.

## 2020-08-19 NOTE — Progress Notes (Signed)
Virtual Visit via Video Note  I connected with Melissa Weiss  on 08/19/20 at  1:40 PM EST by a video enabled telemedicine application and verified that I am speaking with the correct person using two identifiers. Video did not work for her as she was in the car. Completed visit via audio.  Location patient: home, [place] Location provider:work or home office Persons participating in the virtual visit: patient, provider  I discussed the limitations of evaluation and management by telemedicine and the availability of in person appointments. The patient expressed understanding and agreed to proceed.   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  Past Medical History:  Diagnosis Date  . Anxiety state, unspecified   . Breast cancer of upper-outer quadrant of right female breast ([place]) 03/09/2013  . Colitis   . Colon polyp    benign  . Folliculitis 0/16   pseudomonas  . H/O urticaria   . Lipoma of unspecified site   . Neoplasm of unspecified nature of bone, soft tissue, and skin   . Ovarian cyst 2000  . S/P radiation therapy  04/28/2013-06/11/2013   Right breast / 45 Gray @ 1.8 Pearline Cables per fraction x 25 fractions/Right breast boost / 16 Gray at [employer] per fraction x 8 fractions     Past Surgical History:  Procedure Laterality Date  . BREAST LUMPECTOMY Bilateral   . COLONOSCOPY  6/00   few polyps; bx neg  . ESOPHAGOGASTRODUODENOSCOPY  7/00   Neg  . FLEXIBLE SIGMOIDOSCOPY  3/01   Neg  . TONSILLECTOMY  1998     Current Outpatient Medications:  .  amoxicillin (AMOXIL) 500 MG capsule, Take 1 capsule (500 mg total) by mouth 2 (two) times daily., Disp: 20 capsule, Rfl: 0 .  albuterol (VENTOLIN HFA) 108 (90 Base) MCG/ACT inhaler, Inhale 2 puffs into the lungs every 6 (six) hours as needed for wheezing or shortness of breath., Disp: 8 g, Rfl: 0 .  Biotin 2500 MCG CAPS, Take 1 each by mouth daily. Gummies, Disp: , Rfl:  .  Prenatal MV-Min-FA-Omega-3 (PRENATAL GUMMIES/DHA & FA) 0.4-32.5 MG CHEW, Chew 1 each by mouth daily., Disp: , Rfl:  No current facility-administered medications for this visit.  Facility-Administered Medications Ordered in Other Visits:  .  hyaluronate sodium (RADIAPLEXRX) gel, , Topical, Once, Thea Silversmith, MD .  non-metallic deodorant Jethro Poling) 1 application, 1 application, Topical, Once, Thea Silversmith, MD  EXAM:  VITALS per patient if applicable:  GENERAL: alert, oriented, appears well and in no acute distress  HEENT: atraumatic, conjunttiva clear, no obvious abnormalities on inspection of external nose and ears  NECK: normal movements of the head and neck  LUNGS: on inspection no signs of respiratory distress, breathing rate appears  normal, no obvious gross SOB, gasping or wheezing  CV: no obvious cyanosis  MS: moves all visible extremities without noticeable abnormality  PSYCH/NEURO: pleasant and cooperative, no obvious depression or anxiety, speech and thought processing grossly intact  ASSESSMENT AND PLAN:  Discussed the following assessment and plan:  Influenza-like  symptoms  -we discussed possible serious and likely etiologies, options for evaluation and workup, limitations of telemedicine visit vs in person visit, treatment, treatment risks and precautions. Pt prefers to treat via telemedicine empirically rather than in person at this moment.  Query COVID, influenza, possible pharyngitis versus other.  She is concerned about possible strep throat.  She has been able to set up testing at her PCP office later.  Advised strep, flu and COVID testing.  Discussed other COVID testing options and advised that she find out from her PCP office how long the wait is for the COVID test results.  She opted to start on amoxicillin while testing is pending in case of strep throat after discussion risk and benefits.  She declined Tamiflu, as she feels is not likely flu.  Advised prompt follow-up with PCP or video visit if COVID testing is positive given her history and that she is unvaccinated.  Symptomatic care measures summarized in patient instructions. Work/School slipped offered: provided in patient instructions   Advised to seek prompt in person care if worsening, new symptoms arise, or if is not improving with treatment. Discussed options for inperson care if PCP office not available. Did let this patient know that I only do telemedicine on Tuesdays and Thursdays for [place]. Advised to schedule follow up visit with PCP or [facility] if any further questions or concerns to avoid delays in care.  Did let her know if orders are placed under my name from her primary care office, that I will not be back on shift until next Tuesday, so she would need to contact PCP office or check MyChart for her test results.  Addendum: staff from PCP office let me know strep and flu testing was negative and they contacted pt regarding results and let her know to check mychart for covid results.   I discussed the assessment and treatment plan with the patient. The patient was provided an opportunity to  ask questions and all were answered. The patient agreed with the plan and demonstrated an understanding of the instructions.     Melissa Kern, DO    Called to check on patient at end of shift. Fever is down to 100.1 on ibuprofen. She is able to drink fluids well. Is going to continue the amoxicillin in case of strep and is waiting on covid test results. She plans to do follow up video visit or follow up with PCP if covid positive and agrees to go to ucc if worsening, not improving or severe symptoms over the long weekend.

## 2020-08-19 NOTE — Telephone Encounter (Signed)
Patient had a virtual visit today with Dr. Maudie Mercury. See encounter.

## 2020-08-19 NOTE — Telephone Encounter (Signed)
Melissa Weiss - Client TELEPHONE ADVICE RECORD [facility] Patient Name: Melissa Weiss Gender: Female DOB: Nov 29, 1980 Age: 40 Y 66 M 15 D Return Phone Number: [phone] (Primary) Address: City/State/Zip: [place] [address] Client  Primary Care [facility] - Client Client Site [place] - Weiss Physician Copland, Frederico Hamman - MD Contact Type Call Who Is Calling Patient / Member / Family / Caregiver Call Type Triage / Clinical Relationship To Patient Self Return Phone Number [phone] (Primary) Chief Complaint FEVER - any fever in cancer, chemotherapy, oncology, sickle cell or HIV patient. Reason for Call Symptomatic / Request for [place] states she is having some throat pain with white patches, with a 103 fever. Pt is having a mastectomy in 3 weeks. Pt is scheduled for virtual appt. tomorrow. Translation No Nurse Assessment Nurse: Raphael Gibney, RN, [place] Date/Time (Eastern Time): 08/19/2020 1:04:07 PM Confirm and document reason for call. If symptomatic, describe symptoms. ---Caller states she has a sore throat with white patches. Temp 103. she has been diagnosed with breast cancer. has mastectomy scheduled in 3 weeks. She can barely drink fluids. not urinating much. Not taking chemo or radiation Does the patient have any new or worsening symptoms? ---Yes Will a triage be completed? ---Yes Related visit to physician within the last 2 weeks? ---No Does the PT have any chronic conditions? (i.e. diabetes, asthma, this includes High risk factors for pregnancy, etc.) ---Yes List chronic conditions. ---breast cancer Is the patient pregnant or possibly pregnant? (Ask all females between the ages of 32-55) ---No Is this a behavioral health or substance abuse call? ---No Guidelines Guideline Title Affirmed Question Affirmed Notes Nurse Date/Time (Eastern Time) Sore Throat [1] Drinking very  little AND [2] dehydration suspected (e.g., no urine > 12 hours, very dry mouth, very lightheaded) Raphael Gibney, RN, Vanita Ingles 08/19/2020 1:05:57 PM PLEASE NOTE: All timestamps contained within this report are represented as [place] Standard Time. CONFIDENTIALTY NOTICE: This fax transmission is intended only for the addressee. It contains information that is legally privileged, confidential or otherwise protected from use or disclosure. If you are not the intended recipient, you are strictly prohibited from reviewing, disclosing, copying using or disseminating any of this information or taking any action in reliance on or regarding this information. If you have received this fax in error, please notify us immediately by telephone so that we can arrange for its return to [place]. Phone: [phone], Toll-Free: [phone], Fax: [phone] Page: 2 of 2 Call Id: [id number] [place]. Time Eilene Ghazi Time) Disposition Final User 08/19/2020 1:01:56 PM Send to Urgent Queue Cristal Ford 08/19/2020 1:12:31 PM Go to ED Now (or PCP triage) Yes Raphael Gibney, RN, Doreatha Lew Disagree/Comply Disagree Caller Understands Yes PreDisposition Call Doctor Care Advice Given Per Guideline GO TO ED NOW (OR PCP TRIAGE): * IF NO PCP (PRIMARY CARE PROVIDER) SECOND-LEVEL TRIAGE: You need to be seen within the next hour. Go to the [facility] at _____________ [place] as soon as you can. CARE ADVICE given per Sore Throat (Adult) guideline. WATER: * Drink sips of water. * For pain or fever relief, take either acetaminophen or ibuprofen. PAIN AND FEVER MEDICINES: Comments User: Dannielle Burn, RN Date/Time Eilene Ghazi Time): 08/19/2020 1:15:52 PM Called back line and gave report pt has a sore throat with white patches. Temp 103. she has been diagnosed with breast cancer. has mastectomy scheduled in 3 weeks. She can barely drink fluids. not urinating much. Triage outcome of gp to ER now (or PCP triage)  but pt does not want go to go urgent care or  the ER Warm transferred to the nurse Referrals [facility] REFUSED

## 2020-08-19 NOTE — Telephone Encounter (Signed)
[facility] - Client TELEPHONE ADVICE RECORD Access[facility]urse Patient Name: Melissa Weiss Gender: Female DOB: 02/27/1981 Age: 40 Y 8 M 15 D Return Phone Number: [phone] (Primary) Address: City/State/Zip: [place] [address] Client [facility] - Client Client Site [facility] - Day Physician Copland, Spencer - MD Contact Type Call Who Is Calling Patient / Member / Family / Caregiver Call Type Triage / Clinical Relationship To Patient Self Return Phone Number [phone] (Primary) Chief Complaint FEVER - any fever in cancer, chemotherapy, oncology, sickle cell or HIV patient. Reason for Call Symptomatic / Request for Health Information Initial Comment Caller states she is having some throat pain with white patches, with a 103 fever. Pt is having a mastectomy in 3 weeks. Pt is scheduled for virtual appt. tomorrow. Translation No Nurse Assessment Nurse: Stringer, RN, Vera Date/Time (Eastern Time): 08/19/2020 1:04:07 PM Confirm and document reason for call. If symptomatic, describe symptoms. ---Caller states she has a sore throat with white patches. Temp 103. she has been diagnosed with breast cancer. has mastectomy scheduled in 3 weeks. She can barely drink fluids. not urinating much. Not taking chemo or radiation Does the patient have any new or worsening symptoms? ---Yes Will a triage be completed? ---Yes Related visit to physician within the last 2 weeks? ---No Does the PT have any chronic conditions? (i.e. diabetes, asthma, this includes High risk factors for pregnancy, etc.) ---Yes List chronic conditions. ---breast cancer Is the patient pregnant or possibly pregnant? (Ask all females between the ages of 12-55) ---No Is this a behavioral health or substance abuse call? ---No Guidelines Guideline Title Affirmed Question Affirmed Notes Nurse Date/Time (Eastern Time) Sore Throat [1] Drinking very  little AND [2] dehydration suspected (e.g., no urine > 12 hours, very dry mouth, very lightheaded) Stringer, RN, Vera 08/19/2020 1:05:57 PM PLEASE NOTE: All timestamps contained within this report are represented as Eastern Standard Time. CONFIDENTIALTY NOTICE: This fax transmission is intended only for the addressee. It contains information that is legally privileged, confidential or otherwise protected from use or disclosure. If you are not the intended recipient, you are strictly prohibited from reviewing, disclosing, copying using or disseminating any of this information or taking any action in reliance on or regarding this information. If you have received this fax in error, please notify us immediately by telephone so that we can arrange for its return to us. Phone: [phone], Toll-Free: [phone], Fax: [phone] Page: 2 of 2 Call Id: [id number] Disp. Time (Eastern Time) Disposition Final User 08/19/2020 1:01:56 PM Send to Urgent Queue Ego, Leslie 08/19/2020 1:12:31 PM Go to ED Now (or PCP triage) Yes Stringer, RN, Vera Caller Disagree/Comply Disagree Caller Understands Yes PreDisposition Call Doctor Care Advice Given Per Guideline GO TO ED NOW (OR PCP TRIAGE): * IF NO PCP (PRIMARY CARE PROVIDER) SECOND-LEVEL TRIAGE: You need to be seen within the next hour. Go to the ED/UCC at _____________ Hospital. Leave as soon as you can. CARE ADVICE given per Sore Throat (Adult) guideline. WATER: * Drink sips of water. * For pain or fever relief, take either acetaminophen or ibuprofen. PAIN AND FEVER MEDICINES: Comments User: Vera, Stringer, RN Date/Time (Eastern Time): 08/19/2020 1:15:52 PM Called back line and gave report pt has a sore throat with white patches. Temp 103. she has been diagnosed with breast cancer. has mastectomy scheduled in 3 weeks. She can barely drink fluids. not urinating much. Triage outcome of gp to ER now (or PCP triage)   but pt does not want go to go urgent care or  the ER Warm transferred to the nurse Referrals [facility] REFUSED

## 2020-08-20 ENCOUNTER — Telehealth: Payer: Managed Care, Other (non HMO) | Admitting: Family Medicine

## 2020-08-20 ENCOUNTER — Other Ambulatory Visit: Payer: Managed Care, Other (non HMO)

## 2020-08-22 LAB — NOVEL CORONAVIRUS, NAA: SARS-CoV-2, NAA: DETECTED — AB

## 2020-08-22 LAB — SPECIMEN STATUS REPORT

## 2020-08-23 ENCOUNTER — Telehealth: Payer: Self-pay | Admitting: Unknown Physician Specialty

## 2020-08-23 ENCOUNTER — Telehealth: Payer: Self-pay | Admitting: Family

## 2020-08-23 NOTE — Telephone Encounter (Signed)
Called to discuss with patient about COVID-19 symptoms and the use of one of the available treatments for those with mild to moderate Covid symptoms and at a high risk of hospitalization.  Pt appears to qualify for outpatient treatment due to co-morbid conditions and/or a member of an at-risk group in accordance with the FDA Emergency Use Authorization.    Symptom onset: 1/13 Vaccinated: no Immunocompromised? Hx breast ca Qualifiers: hx of cancer  Unable to reach pt - [facility]  Kathrine Haddock

## 2020-08-23 NOTE — Telephone Encounter (Signed)
Called to discuss with patient about COVID-19 symptoms and the use of one of the available treatments for those with mild to moderate Covid symptoms and at a high risk of hospitalization.  Pt appears to qualify for outpatient treatment due to co-morbid conditions and/or a member of an at-risk group in accordance with the FDA Emergency Use Authorization.    Symptom onset: 08/18/20 Vaccinated: No  Immunocompromised? Breast Cancer Qualifiers: Breast Cancer  I spoke with Ms. Sackrider regarding the risks, benefits, and potential costs associated with monoclonal antibody treatment with Sotrovimab and she would like to think about it and see how she does. I did inform her that she would need to receive the medication tomorrow to meet eligibility criteria. Call back number provided.  Terri Piedra, NP 08/23/2020 3:01 PM

## 2022-01-03 IMAGING — CR DG CHEST 2V
1 series · 2 of 2 positions shown · non-contrast
Comparison: 04/12/2011 chest radiograph

CLINICAL DATA: Chest pain and shortness of breath.

EXAM:
CHEST - 2 VIEW

[Series 1: dg chest 2 view · 0.14mm/px · 2 of 2 slices shown]
[im 1/2]
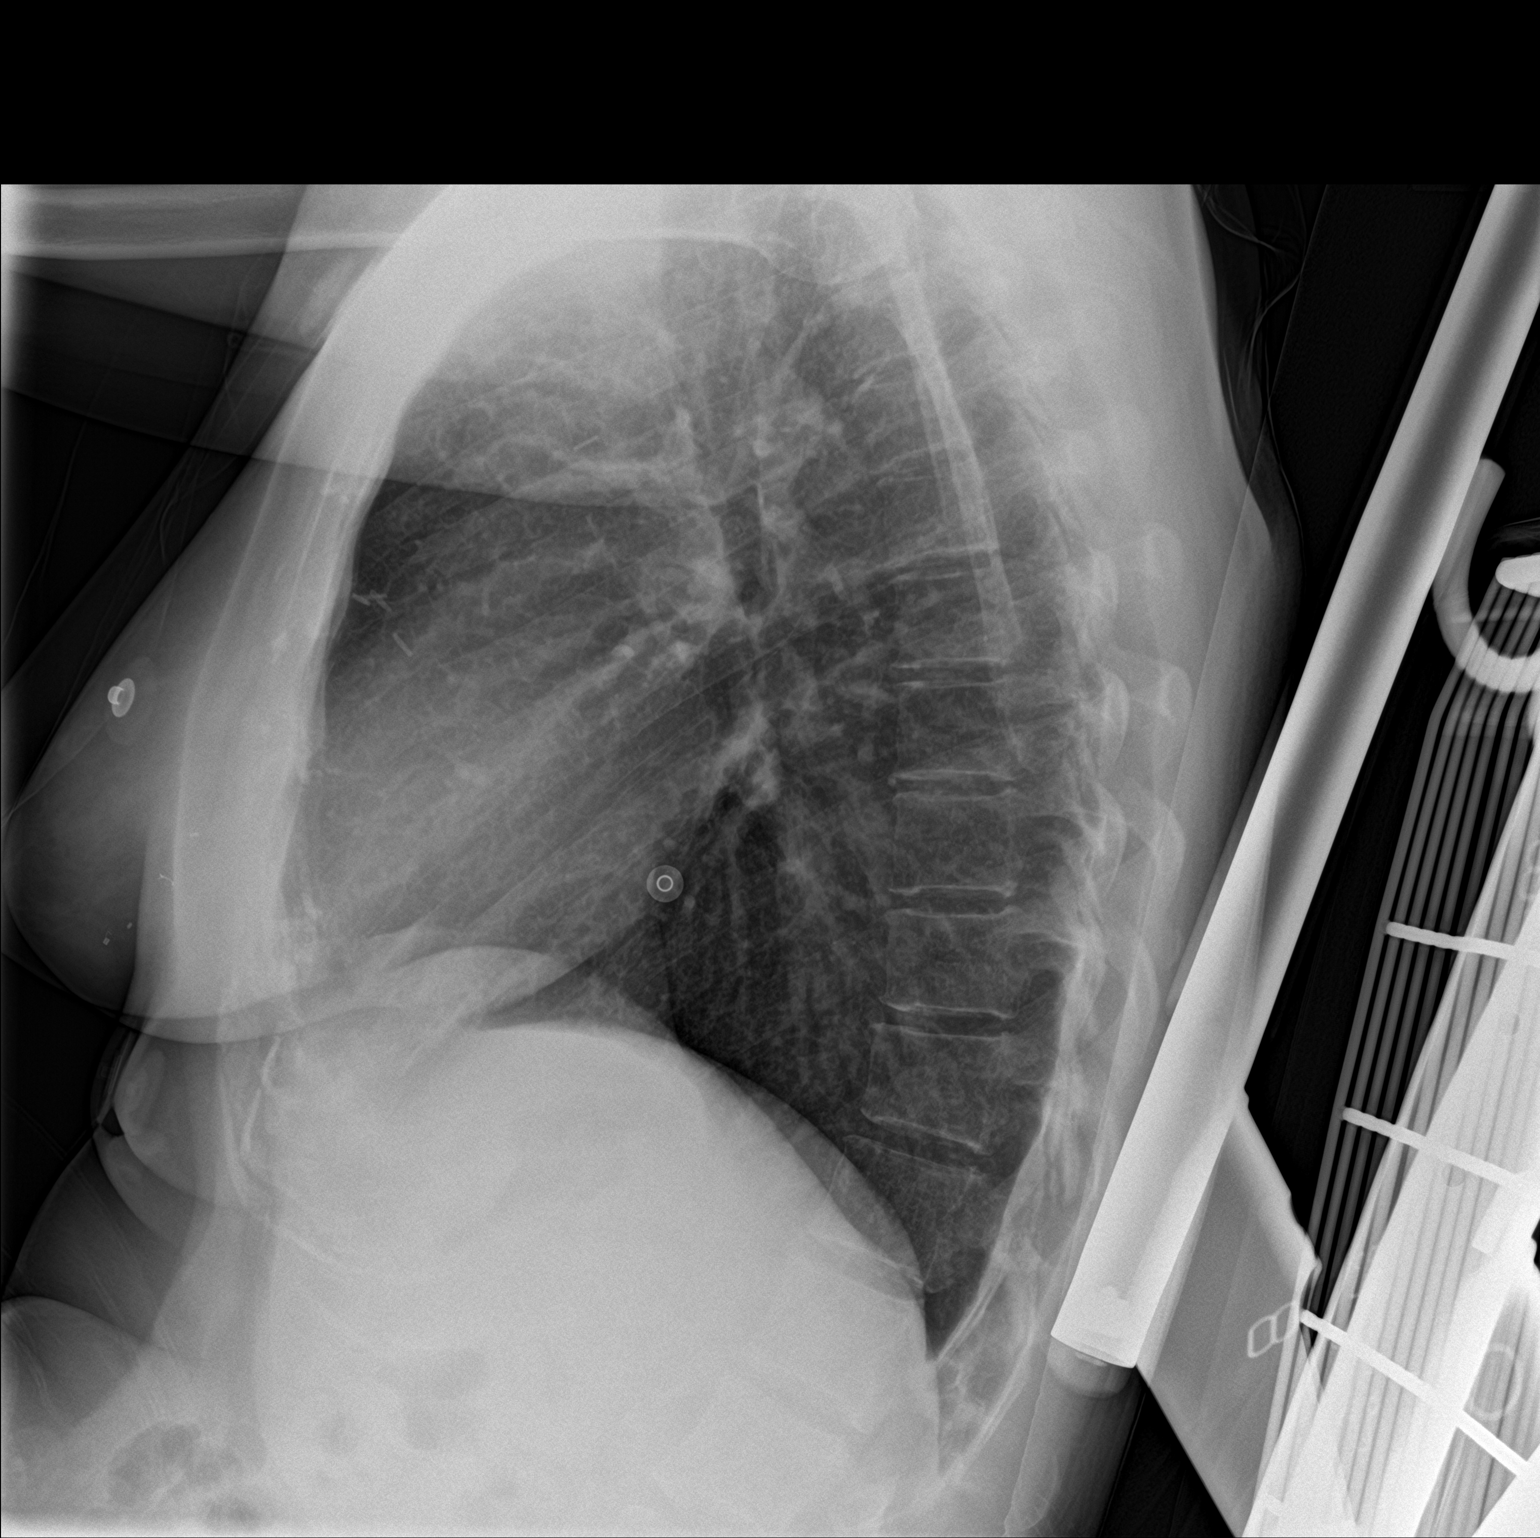
[im 2/2]
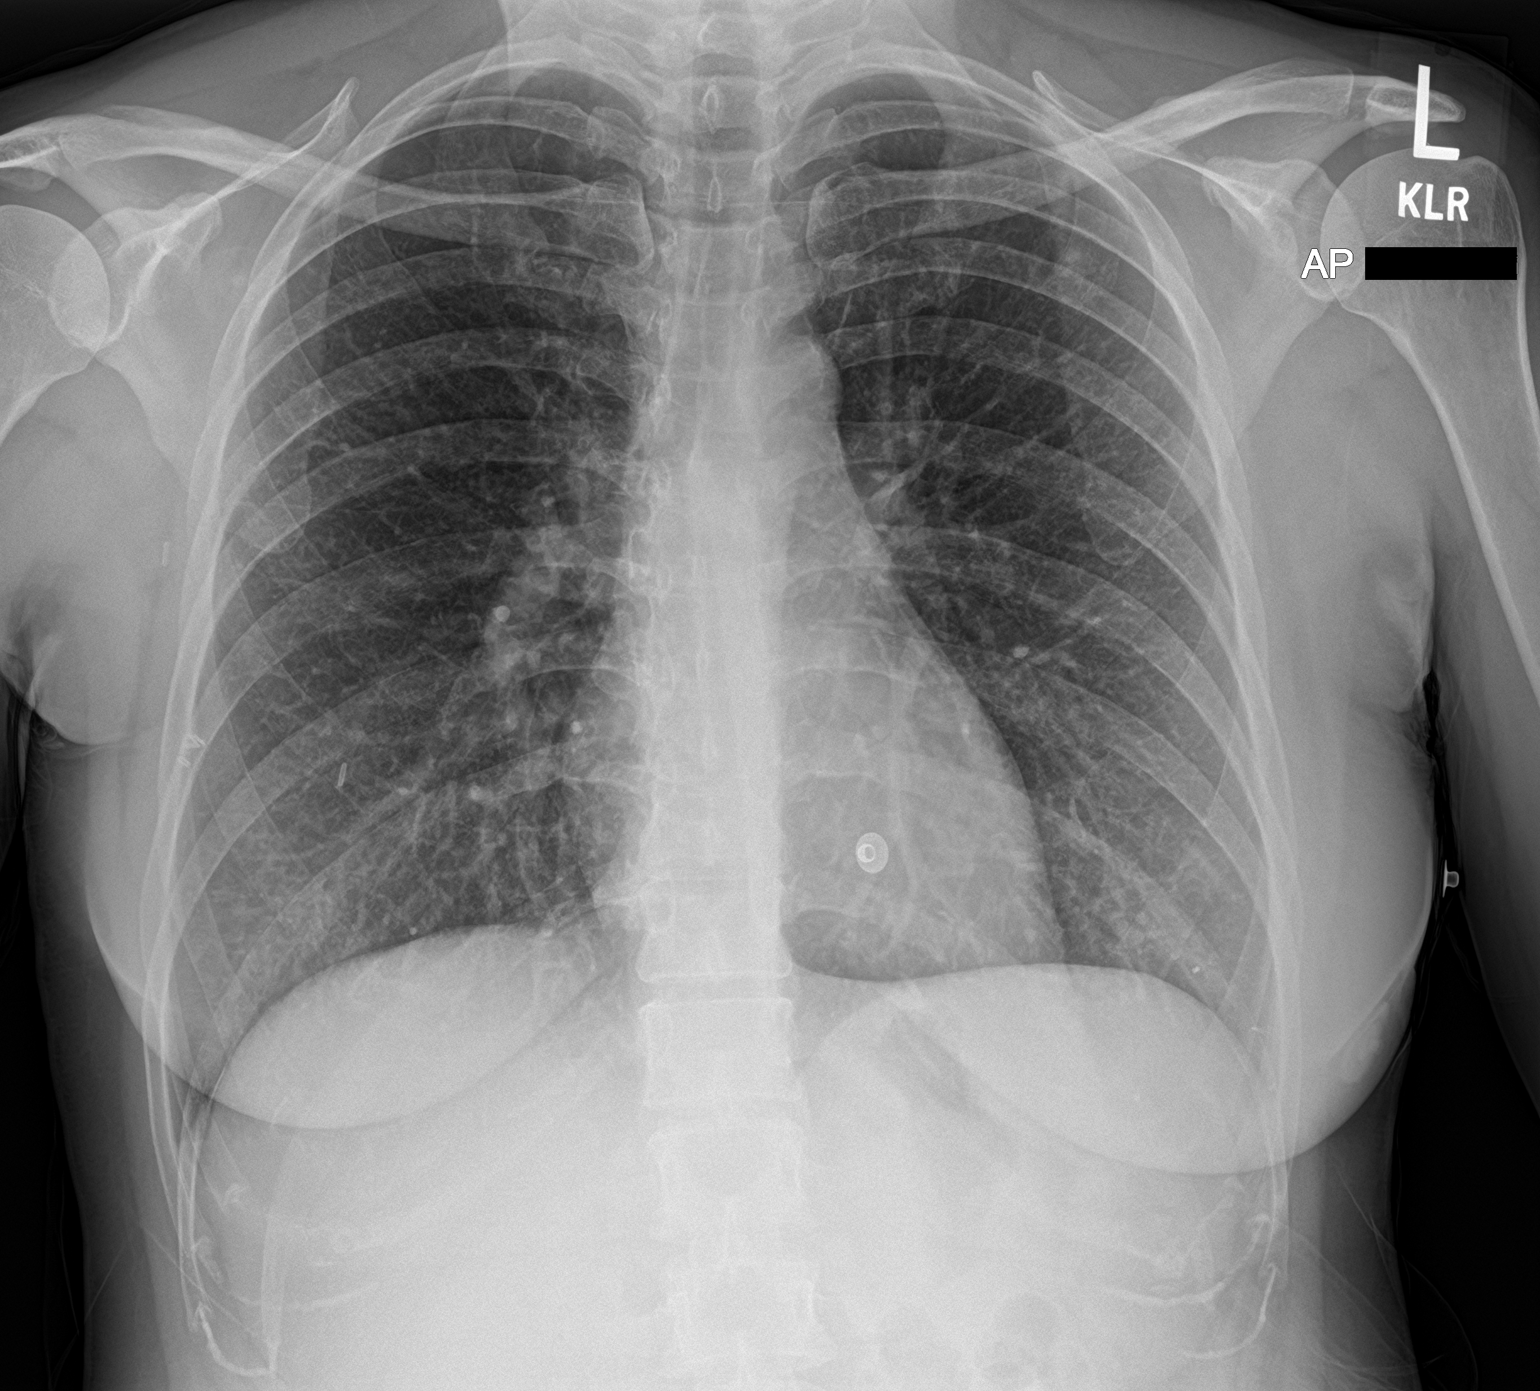

[2 of 2 positions shown; findings below may reference images not displayed]

FINDINGS: The cardiomediastinal silhouette is unremarkable.

There is no evidence of focal airspace disease, pulmonary edema,
suspicious pulmonary nodule/mass, pleural effusion, or pneumothorax.

No acute bony abnormalities are identified.
IMPRESSION: No active cardiopulmonary disease.

## 2022-03-06 ENCOUNTER — Other Ambulatory Visit: Payer: Self-pay

## 2022-03-06 ENCOUNTER — Encounter (HOSPITAL_COMMUNITY): Payer: Self-pay

## 2022-03-06 ENCOUNTER — Emergency Department (HOSPITAL_COMMUNITY)
Admission: EM | Admit: 2022-03-06 | Discharge: 2022-03-06 | Disposition: A | Discharge status: Home / Self Care | Payer: Managed Care, Other (non HMO) | Attending: Emergency Medicine | Admitting: Emergency Medicine

## 2022-03-06 ENCOUNTER — Emergency Department (HOSPITAL_COMMUNITY): Payer: Managed Care, Other (non HMO)

## 2022-03-06 DIAGNOSIS — R55 Syncope and collapse: Secondary | ICD-10-CM | POA: Insufficient documentation

## 2022-03-06 DIAGNOSIS — R000 Tachycardia, unspecified: Secondary | ICD-10-CM | POA: Insufficient documentation

## 2022-03-06 DIAGNOSIS — Z853 Personal history of malignant neoplasm of breast: Secondary | ICD-10-CM | POA: Diagnosis not present

## 2022-03-06 DIAGNOSIS — N939 Abnormal uterine and vaginal bleeding, unspecified: Secondary | ICD-10-CM | POA: Diagnosis not present

## 2022-03-06 LAB — COMPREHENSIVE METABOLIC PANEL
ALT: 13 U/L (ref 0–44)
AST: 18 U/L (ref 15–41)
Albumin: 3.9 g/dL (ref 3.5–5.0)
Alkaline Phosphatase: 58 U/L (ref 38–126)
Anion gap: 7 (ref 5–15)
BUN: 14 mg/dL (ref 6–20)
CO2: 24 mmol/L (ref 22–32)
Calcium: 8.9 mg/dL (ref 8.9–10.3)
Chloride: 108 mmol/L (ref 98–111)
Creatinine, Ser: 0.75 mg/dL (ref 0.44–1.00)
GFR, Estimated: >60 mL/min (ref >60)
Glucose, Bld: 114 mg/dL — ABNORMAL HIGH (ref 70–99)
Potassium: 4.1 mmol/L (ref 3.5–5.1)
Sodium: 139 mmol/L (ref 135–145)
Total Bilirubin: 0.7 mg/dL (ref 0.3–1.2)
Total Protein: 7.2 g/dL (ref 6.5–8.1)

## 2022-03-06 LAB — CBC WITH DIFFERENTIAL/PLATELET
Abs Immature Granulocytes: 0.02 10*3/uL (ref 0.00–0.07)
Basophils Absolute: 0 10*3/uL (ref 0.0–0.1)
Basophils Relative: 0 %
Eosinophils Absolute: 0.1 10*3/uL (ref 0.0–0.5)
Eosinophils Relative: 2 %
HCT: 41.3 % (ref 36.0–46.0)
Hemoglobin: 13.6 g/dL (ref 12.0–15.0)
Immature Granulocytes: 0 %
Lymphocytes Relative: 15 %
Lymphs Abs: 1 10*3/uL (ref 0.7–4.0)
MCH: 28 pg (ref 26.0–34.0)
MCHC: 32.9 g/dL (ref 30.0–36.0)
MCV: 85 fL (ref 80.0–100.0)
Monocytes Absolute: 0.4 10*3/uL (ref 0.1–1.0)
Monocytes Relative: 6 %
Neutro Abs: 5.4 10*3/uL (ref 1.7–7.7)
Neutrophils Relative %: 77 %
Platelets: 202 10*3/uL (ref 150–400)
RBC: 4.86 MIL/uL (ref 3.87–5.11)
RDW: 21.1 % — ABNORMAL HIGH (ref 11.5–15.5)
WBC: 7 10*3/uL (ref 4.0–10.5)
nRBC: 0 % (ref 0.0–0.2)

## 2022-03-06 LAB — TYPE AND SCREEN
ABO/RH(D): A POS
Antibody Screen: NEGATIVE

## 2022-03-06 LAB — I-STAT BETA HCG BLOOD, ED (MC, WL, AP ONLY): I-stat hCG, quantitative: <5 m[IU]/mL (ref <5)

## 2022-03-06 LAB — HEMOGLOBIN AND HEMATOCRIT, BLOOD
HCT: 34.7 % — ABNORMAL LOW (ref 36.0–46.0)
Hemoglobin: 11.5 g/dL — ABNORMAL LOW (ref 12.0–15.0)

## 2022-03-06 LAB — TROPONIN I (HIGH SENSITIVITY): Troponin I (High Sensitivity): <2 ng/L (ref <18)

## 2022-03-06 MED ORDER — SODIUM CHLORIDE 0.9 % IV BOLUS
1000.0000 mL | Freq: Once | INTRAVENOUS | Status: AC
  Administered 2022-03-06: 1000 mL via INTRAVENOUS

## 2022-03-06 MED ORDER — FERROUS SULFATE 325 (65 FE) MG PO TABS: 325.0000 mg | ORAL_TABLET | Freq: Every day | ORAL | 0 refills | Status: DC

## 2022-03-06 MED ORDER — IBUPROFEN 400 MG PO TABS
400.0000 mg | ORAL_TABLET | Freq: Once | ORAL | Status: AC
  Administered 2022-03-06: 400 mg via ORAL
  Filled 2022-03-06: qty 1

## 2022-03-06 MED ORDER — TRANEXAMIC ACID 650 MG PO TABS
1300.0000 mg | ORAL_TABLET | Freq: Once | ORAL | Status: DC
  Filled 2022-03-06 (×2): qty 2

## 2022-03-06 MED ORDER — TRANEXAMIC ACID 650 MG PO TABS: 1300.0000 mg | ORAL_TABLET | Freq: Three times a day (TID) | ORAL | 0 refills | Status: AC

## 2022-03-06 MED ORDER — ONDANSETRON 4 MG PO TBDP
4.0000 mg | ORAL_TABLET | Freq: Once | ORAL | Status: AC
Start: 2022-03-06 — End: 2022-03-06
  Administered 2022-03-06: 4 mg via ORAL
  Filled 2022-03-06: qty 1

## 2022-03-06 NOTE — ED Notes (Signed)
Patient returned from u/s

## 2022-03-06 NOTE — Progress Notes (Signed)
Patient ID: BRAYLON LEMMONS, female   DOB: [birth year], 41 y.o.   MRN: [id number]   OB/GYN Telephone Consult  03/06/2022   JOELEE SNOKE is a 41 y.o. No obstetric history on file. who is currently not pregnant presenting to [facility].   I was called for a consult regarding the care of this patient by the Physician (MD/DO) caring for the patient.   The provider had the following clinical question: Use of Lysteda in pateint with bleeding, CuIUD in place and h/o breast cancer (not a candidate for hormones).  The provider presented the following relevant clinical information: Hgb 13 U/s reveals small fibroid  I performed a chart review on the patient and reviewed available documentation.  BP 117/83   Pulse (!) 106   Temp 98.8 F (37.1 C)   Resp 16   Ht '5\' 4"'$  (1.626 m)   Wt 63.5 kg   SpO2 98%   BMI 24.03 kg/m   Exam- performed by consulting provider   Recommendations:  -Lysteda 1300 mg tid for acute bleeding during cycle -f/u with her OB/GYN or we can arrange f/u in our office if needed. -Consider IUD removal as an outpatient once has other contraception worked out as this might be contributing  -Recommended MD/APP provide the patient with a referral to the [facility] for [employer] [facility] (any office) for follow up in  4 weeks.   Thank you for this consult and if additional recommendations are needed please call [phone] for the OB/GYN attending on service at [facility].   I spent approximately 5 minutes directly consulting with the provider and verbally discussing this case. Additionally 10 minutes minutes was spent performing chart review and documentation.    Donnamae Jude, MD

## 2022-03-06 NOTE — ED Triage Notes (Signed)
Pt arrived POV from home c/o vaginal bleeding. Pt states she started her period yesterday but then last night started heavy bleeding. Pt states she is going through super tampons about 20 mins. Pt is endorsing lower back pain, blurry vision and fatigue.

## 2022-03-06 NOTE — Discharge Instructions (Addendum)
As we discussed, the GYN doctor recommend Lysteda (TXA) 3 times daily  Please take iron pills to help you produce more red blood cells  Please call either our GYN office tomorrow or [facility] for an urgent appointment to discuss hysterectomy or other options to control your bleeding  Return to ER if you have uncontrolled bleeding, passing out, shortness of breath

## 2022-03-06 NOTE — ED Triage Notes (Addendum)
Patient provided pads

## 2022-03-06 NOTE — ED Notes (Signed)
Patient transported to us 

## 2022-03-06 NOTE — ED Provider Notes (Signed)
  Physical Exam  BP (!) 141/96   Pulse 93   Temp 98.2 F (36.8 C)   Resp 20   Ht '5\' 4"'$  (1.626 m)   Wt 63.5 kg   SpO2 98%   BMI 24.03 kg/m   Physical Exam  Procedures  Procedures  ED Course / MDM   Clinical Course as of 03/06/22 1930  Tue Mar 06, 2022  0925 41 yo female w/ hx of breast malignancy s/p mastectomy, in remission, hx of iron deficiency anemia.  Here with vaginal bleeding, on day 2 of menstrual period, but much heavier bleeding than normal. [MT]  1019 Pelvic exam: normal, atraumatic vaginal mucosa. No obvious cervical lesions. No evidence of displaced IUD. Blood trickling from cervical os. [MM]  1020 On exam she had some clots in vaginal vault which were cleared, small amount of bleeding from cervical os, no hemorrhaging.  She did become tachycardic and lightheaded with standing.  Will add troponin.  Hgb normal 13.6.  No indication for emergent transfusion.  We'll start oral hydration with water - difficult IV access as her veins seem collapsed on ultrasound attempt.  USIV team consulted for vascular access. [MT]  1021 Pt cannot have any hormonal medications due to prior malignancy [MT]  1306 Pelvic ultrasound shows 7 mm hypodensity in posterior uterine wall, thought to be a leiomyoma.  Normal endometrial thickness.  IUD not malpositioned.  Ovaries normal without masses. [MM]  0938 Case discussed with OB/GYN physician on-call.  They recommended TXA 1300 mg 3 times daily while menstruating and outpatient follow-up for removal of IUD within 1 to 2-weeks. [MM]  1829 ECG Heart Rate: 98 Heart rate improved.  Will obtain H&H for repeat hemoglobin.  If hemoglobin looks okay and patient can ambulate without difficulty we will discharge, otherwise keep overnight for observation. [MM]    Clinical Course User Index [MM] Nani Gasser, MD [MT] Langston Masker Carola Rhine, MD   Medical Decision Making Care assumed at 3 PM.  Patient is here with vaginal bleeding.  Case was discussed with OB  earlier on.  Signed out pending repeat hemoglobin and TXA  7:31 PM Repeat hemoglobin is 11.5 down from 13.5.  Patient still has some mild bleeding.  She does not want to take the TXA as it may cause some clots.  I discussed case with the on-call GYN doctor, Dr. Harolyn Rutherford.  She reviewed the case and states that patient does not qualify for emergent hysterectomy.  She is happy to discuss with the patient in clinic.  Problems Addressed: Abnormal uterine bleeding (AUB): acute illness or injury  Amount and/or Complexity of Data Reviewed Labs: ordered. Decision-making details documented in ED Course. Radiology: ordered and independent interpretation performed. Decision-making details documented in ED Course. ECG/medicine tests: ordered and independent interpretation performed. Decision-making details documented in ED Course.  Risk Prescription drug management.          Drenda Freeze, MD 03/06/22 [phone]

## 2022-03-06 NOTE — ED Provider Notes (Signed)
[facility] EMERGENCY DEPARTMENT Provider Note   CSN: [id number] Arrival date & time: 03/06/22  0831     History  Chief Complaint  Patient presents with   Vaginal Bleeding    Melissa Weiss is a 41 y.o. female with history of breast cancer status post right-sided mastectomy, iron deficiency anemia, copper IUD.  Vaginal Bleeding While getting ready for work this morning patient noted heavy vaginal bleeding.  She is passing lots of bright red blood mixed with clots.  She is going through a pad every 20 to 30 minutes.  She called her OB/GYN who recommended she come to the ED for evaluation.  She is on day 2 of her period.  Her periods have been on the heavier side for couple of years now, but today's bleeding is much worse than usual.  Vaginal bleeding is associated with new left lower back pain.  She denies pelvic pain or pressure.  She does not think she is pregnant.  She denies history of STIs.     Home Medications Prior to Admission medications   Medication Sig Start Date End Date Taking? Authorizing Provider  tranexamic acid (LYSTEDA) 650 MG TABS tablet Take 2 tablets (1,300 mg total) by mouth 3 (three) times daily for 10 days. 03/06/22 03/16/22 Yes Nani Gasser, MD      Allergies    Codeine, Erythromycin, Ferrous sulfate, Lidocaine, Vicodin [hydrocodone-acetaminophen], and Epinephrine    Review of Systems   Review of Systems  Genitourinary:  Positive for vaginal bleeding.    Physical Exam Updated Vital Signs BP 111/72   Pulse 97   Temp 98.8 F (37.1 C)   Resp 16   Ht '5\' 4"'$  (1.626 m)   Wt 63.5 kg   SpO2 98%   BMI 24.03 kg/m  Physical Exam Vitals and nursing note reviewed.  Constitutional:      General: She is not in acute distress.    Appearance: She is well-developed.  HENT:     Head: Normocephalic and atraumatic.  Eyes:     Conjunctiva/sclera: Conjunctivae normal.  Cardiovascular:     Rate and Rhythm: Regular rhythm. Tachycardia  present.     Heart sounds: No murmur heard. Pulmonary:     Effort: Pulmonary effort is normal. No respiratory distress.     Breath sounds: Normal breath sounds.  Abdominal:     Palpations: Abdomen is soft.     Tenderness: There is no abdominal tenderness.  Genitourinary:    Comments: Clotted blood in vaginal vault. Normal atraumatic vaginal mucosa. Musculoskeletal:        General: No swelling.     Cervical back: Neck supple.  Skin:    General: Skin is warm and dry.     Capillary Refill: Capillary refill takes less than 2 seconds.  Neurological:     Mental Status: She is alert.  Psychiatric:        Mood and Affect: Mood normal.     ED Results / Procedures / Treatments   Labs (all labs ordered are listed, but only abnormal results are displayed) Labs Reviewed  CBC WITH DIFFERENTIAL/PLATELET - Abnormal; Notable for the following components:      Result Value   RDW 21.1 (*)    All other components within normal limits  COMPREHENSIVE METABOLIC PANEL - Abnormal; Notable for the following components:   Glucose, Bld 114 (*)    All other components within normal limits  HEMOGLOBIN AND HEMATOCRIT, BLOOD  I-STAT BETA HCG BLOOD, ED ([facility],  [facility], [facility] ONLY)  TYPE AND SCREEN  TROPONIN I (HIGH SENSITIVITY)    EKG None  Radiology US PELVIC COMPLETE W TRANSVAGINAL AND TORSION R/O  Result Date: 03/06/2022 CLINICAL DATA:  Vaginal bleeding. History of breast cancer. Positive for BR 8 cc gene. Family history of ovarian cancer. EXAM: TRANSABDOMINAL AND TRANSVAGINAL ULTRASOUND OF PELVIS DOPPLER ULTRASOUND OF OVARIES TECHNIQUE: Both transabdominal and transvaginal ultrasound examinations of the pelvis were performed. Transabdominal technique was performed for global imaging of the pelvis including uterus, ovaries, adnexal regions, and pelvic cul-de-sac. It was necessary to proceed with endovaginal exam following the transabdominal exam to visualize the . Color and duplex Doppler ultrasound was utilized  to evaluate blood flow to the ovaries. COMPARISON:  None Available. FINDINGS: Uterus Measurements: 9.1 x 6.0 x 7.3 cm = volume: 209 mL. Small hypoechoic density in the posterior wall of the uterine body measures 7 mm. Endometrium Thickness: 2.9 mm. IUD within endometrial canal in expected location. Right ovary Measurements: 2.3 x 2.0 x 1.4 cm = volume: 3.5 mL. Normal appearance/no adnexal mass. Left ovary Measurements: 3.2 x 1.8 x 3.6 cm = volume: 11 mL. Normal appearance/no adnexal mass. Pulsed Doppler evaluation of both ovaries demonstrates normal low-resistance arterial and venous waveforms. Other findings No abnormal free fluid. IMPRESSION: 1. Normal ovaries. No ovarian mass. Normal spectral and color Doppler flow to LEFT and RIGHT ovary. 2. Normal uterus.  Probable small leiomyoma in the posterior wall. 3. IUD within endometrial canal. Endometrium appears normal otherwise. Electronically Signed   By: Suzy Bouchard M.D.   On: 03/06/2022 12:58    Procedures Pelvic exam  Date/Time: 03/06/2022 10:00 AM  Performed by: Nani Gasser, MD Authorized by: Wyvonnia Dusky, MD  Consent: Verbal consent obtained. Consent given by: patient       Medications Ordered in ED Medications  tranexamic acid (LYSTEDA) tablet 1,300 mg (0 mg Oral Hold 03/06/22 1525)  ondansetron (ZOFRAN-ODT) disintegrating tablet 4 mg (4 mg Oral Given 03/06/22 1024)  sodium chloride 0.9 % bolus 1,000 mL (0 mLs Intravenous Stopped 03/06/22 1323)  ibuprofen (ADVIL) tablet 400 mg (400 mg Oral Given 03/06/22 1525)  sodium chloride 0.9 % bolus 1,000 mL (1,000 mLs Intravenous New Bag/Given 03/06/22 1525)    ED Course/ Medical Decision Making/ A&P Clinical Course as of 03/06/22 1532  Tue Mar 06, 2022  0925 41 yo female w/ hx of breast malignancy s/p mastectomy, in remission, hx of iron deficiency anemia.  Here with vaginal bleeding, on day 2 of menstrual period, but much heavier bleeding than normal. [MT]  1019 Pelvic exam: normal,  atraumatic vaginal mucosa. No obvious cervical lesions. No evidence of displaced IUD. Blood trickling from cervical os. [MM]  1020 On exam she had some clots in vaginal vault which were cleared, small amount of bleeding from cervical os, no hemorrhaging.  She did become tachycardic and lightheaded with standing.  Will add troponin.  Hgb normal 13.6.  No indication for emergent transfusion.  We'll start oral hydration with water - difficult IV access as her veins seem collapsed on ultrasound attempt.  USIV team consulted for vascular access. [MT]  1021 Pt cannot have any hormonal medications due to prior malignancy [MT]  1306 Pelvic ultrasound shows 7 mm hypodensity in posterior uterine wall, thought to be a leiomyoma.  Normal endometrial thickness.  IUD not malpositioned.  Ovaries normal without masses. [MM]  9628 Case discussed with OB/GYN physician on-call.  They recommended TXA 1300 mg 3 times daily while menstruating and outpatient follow-up  for removal of IUD within 1 to 2-weeks. [MM]  9518 ECG Heart Rate: 98 Heart rate improved.  Will obtain H&H for repeat hemoglobin.  If hemoglobin looks okay and patient can ambulate without difficulty we will discharge, otherwise keep overnight for observation. [MM]    Clinical Course User Index [MM] Nani Gasser, MD [MT] Wyvonnia Dusky, MD                           Medical Decision Making This patient presents to the ED for vaginal bleeding and tachycardia, this involves an extensive number of treatment options, and is a complaint that carries with it a high risk of complications and morbidity.  The differential diagnosis includes ectopic pregnancy, gynecologic malignancy, malpositioned IUD, heavy menstrual period.  Co morbidities that complicate the patient evaluation      Iron deficiency anemia, tachycardia  External records from outside source obtained and reviewed including medical records from [facility].  Patient received OB/GYN care  there and iron transfusions for iron deficiency anemia.  Lab Tests: I Ordered, and personally interpreted labs.  The pertinent results include: - CBC: Hemoglobin normal - CMP: Within normal limits -Beta-hCG: Negative for pregnancy  Imaging Studies ordered: I ordered imaging studies including pelvic ultrasound I independently visualized and interpreted imaging which showed intrauterine IUD.  0.7 cm hypodensity in the posterior uterine wall. I agree with the radiologist interpretation  Cardiac Monitoring:      The patient was maintained on a cardiac monitor.  I personally viewed and interpreted the cardiac monitored which showed an underlying rhythm of: Tachycardia  Medicines ordered and prescription drug management: I ordered medication including NS bolus 1000 cc IV for presumed hypokalemia Ibuprofen 400 mg p.o. for menstrual bleeding and cramps Zofran ODT Tranexamic acid 1300 mg p.o. 3 times daily as needed for heavy menstrual bleeding  Reevaluation of the patient after these medicines showed that the patient improved I have reviewed the patients home medicines and have made adjustments as needed  Test Considered:      CT pelvis, not ordered based on results of pelvic ultrasound  Consultations Obtained: I requested consultation with the OB/GYN,  and discussed lab and imaging findings as well as pertinent plan - they recommend: Tranexamic acid 1300 mg 3 times daily for heavy menstrual bleeding, and outpatient follow-up with primary OB/GYN in 1 to 2 weeks for removal of IUD  Problem List / ED Course:      Abnormal uterine bleeding Tachycardia Presyncope  Reevaluation: After the interventions noted above, I reevaluated the patient and found that they have :improved  Social Determinants of Health:      Patient lives in [place] with husband and 2 children.  She is an Nurse, children's.  She does not drink, smoke or use drugs.  Dispostion:  Patient signed out to oncoming  team.    Amount and/or Complexity of Data Reviewed Labs: ordered. Decision-making details documented in ED Course. Radiology: ordered. Decision-making details documented in ED Course.  Risk Prescription drug management.         Final Clinical Impression(s) / ED Diagnoses Final diagnoses:  Abnormal uterine bleeding (AUB)    Rx / DC Orders ED Discharge Orders          Ordered    tranexamic acid (LYSTEDA) 650 MG TABS tablet  3 times daily        03/06/22 1506  Nani Gasser, MD 03/06/22 1533    Wyvonnia Dusky, MD 03/06/22 [phone]

## 2022-03-07 ENCOUNTER — Telehealth: Payer: Self-pay | Admitting: Surgery

## 2022-03-07 NOTE — Telephone Encounter (Signed)
ED RNCM received call from patient regarding referral to GYN.  Confirmed with patient ambulatory referral for [facility] with [facility]  AVS also notes that patient will call [facility] to schedule to schedule a OB/GYN appointment if there is a sooner appt. Updated patient on this AVS information.

## 2022-03-30 LAB — HM PAP SMEAR

## 2022-03-30 LAB — RESULTS CONSOLE HPV: CHL HPV: POSITIVE

## 2022-04-06 ENCOUNTER — Ambulatory Visit: Payer: Managed Care, Other (non HMO) | Admitting: Family

## 2022-04-06 ENCOUNTER — Ambulatory Visit (INDEPENDENT_AMBULATORY_CARE_PROVIDER_SITE_OTHER)
Admission: RE | Admit: 2022-04-06 | Discharge: 2022-04-06 | Disposition: A | Discharge status: Home / Self Care | Payer: Managed Care, Other (non HMO) | Source: Ambulatory Visit | Attending: Family

## 2022-04-06 ENCOUNTER — Encounter: Payer: Self-pay | Admitting: Family

## 2022-04-06 VITALS — BP 112/72 | HR 86 | Temp 98.1°F | Resp 16 | Ht 64.0 in | Wt 138.2 lb

## 2022-04-06 DIAGNOSIS — Z20822 Contact with and (suspected) exposure to covid-19: Secondary | ICD-10-CM | POA: Diagnosis not present

## 2022-04-06 DIAGNOSIS — J45901 Unspecified asthma with (acute) exacerbation: Secondary | ICD-10-CM | POA: Diagnosis not present

## 2022-04-06 DIAGNOSIS — R87811 Vaginal high risk human papillomavirus (HPV) DNA test positive: Secondary | ICD-10-CM

## 2022-04-06 DIAGNOSIS — R062 Wheezing: Secondary | ICD-10-CM

## 2022-04-06 DIAGNOSIS — D509 Iron deficiency anemia, unspecified: Secondary | ICD-10-CM | POA: Diagnosis not present

## 2022-04-06 DIAGNOSIS — R509 Fever, unspecified: Secondary | ICD-10-CM | POA: Diagnosis not present

## 2022-04-06 LAB — POC INFLUENZA A&B (BINAX/QUICKVUE)
Influenza A, POC: NEGATIVE
Influenza B, POC: NEGATIVE

## 2022-04-06 LAB — CBC WITH DIFFERENTIAL/PLATELET
Basophils Absolute: 0 10*3/uL (ref 0.0–0.1)
Basophils Relative: 0.6 % (ref 0.0–3.0)
Eosinophils Absolute: 0.4 10*3/uL (ref 0.0–0.7)
Eosinophils Relative: 7.6 % — ABNORMAL HIGH (ref 0.0–5.0)
HCT: 37.7 % (ref 36.0–46.0)
Hemoglobin: 12.7 g/dL (ref 12.0–15.0)
Lymphocytes Relative: 14.7 % (ref 12.0–46.0)
Lymphs Abs: 0.8 10*3/uL (ref 0.7–4.0)
MCHC: 33.8 g/dL (ref 30.0–36.0)
MCV: 85.8 fl (ref 78.0–100.0)
Monocytes Absolute: 0.4 10*3/uL (ref 0.1–1.0)
Monocytes Relative: 7.2 % (ref 3.0–12.0)
Neutro Abs: 3.7 10*3/uL (ref 1.4–7.7)
Neutrophils Relative %: 69.9 % (ref 43.0–77.0)
Platelets: 242 10*3/uL (ref 150.0–400.0)
RBC: 4.4 Mil/uL (ref 3.87–5.11)
RDW: 14.9 % (ref 11.5–15.5)
WBC: 5.3 10*3/uL (ref 4.0–10.5)

## 2022-04-06 LAB — POC COVID19 BINAXNOW: SARS Coronavirus 2 Ag: NEGATIVE

## 2022-04-06 MED ORDER — AMOXICILLIN-POT CLAVULANATE 875-125 MG PO TABS: 1.0000 | ORAL_TABLET | Freq: Two times a day (BID) | ORAL | 0 refills | Status: DC

## 2022-04-06 MED ORDER — PREDNISONE 20 MG PO TABS: ORAL_TABLET | ORAL | 0 refills | Status: DC

## 2022-04-06 NOTE — Assessment & Plan Note (Signed)
bbs wnl on exam however pt overly concerned, requesting chest xray to r/o pneumonia.  Will order cxr, pending results. rx prednisone 20 mg

## 2022-04-06 NOTE — Assessment & Plan Note (Signed)
Repeat cbc pending results 

## 2022-04-06 NOTE — Assessment & Plan Note (Signed)
covid tested and negative

## 2022-04-06 NOTE — Assessment & Plan Note (Signed)
covid and flu tested today in office ,both negative.  Tylenol ibuprofen prn

## 2022-04-06 NOTE — Progress Notes (Signed)
Established Patient Office Visit  Subjective:  Patient ID: Melissa Weiss, female    DOB: 04/06/1981  Age: 41 y.o. MRN: [id number]  CC:  Chief Complaint  Patient presents with   Wheezing    X 2 days  low grade fever     HPI Melissa Weiss is here today with concerns.   Three weeks ago started with some symptoms, but now today and in the last few days increasing sob, wheezing, and increasing lethargy. Was productive sputum however now only dry.   No sore throat. No ear pain but does fill bil fullness. Does have pnd and some hoarseness. Teeth and jaw hurt.  Does have sinus tenderness, low grade fever up to 100.3 F.   Seven years ago, with breast cancer, completed radiation.  Seven years later came back in right breast. Last feb mastectomy right side.  As of now no signs of disease.   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  She had a uterus biopsy to r/o cancer, they want to do a full hysterectomy.   Lab Results  Component Value Date   WBC 7.0 03/06/2022   HGB 11.5 (L) 03/06/2022   HCT 34.7 (L) 03/06/2022   MCV 85.0 03/06/2022   PLT 202 03/06/2022     Past Medical History:  Diagnosis Date   Anxiety state, unspecified    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   Colitis    Colon polyp    benign   Folliculitis 3/08   pseudomonas   H/O urticaria    Lipoma of unspecified site    Neoplasm of unspecified nature of bone, soft tissue, and skin    Ovarian cyst 2000   S/P radiation therapy 04/28/2013-06/11/2013   Right breast / 45 Gray @ 1.8 Wallace Cullens per fraction x 25 fractions/Right breast boost / 16 Gray at [employer] per fraction x 8 fractions     Past Surgical History:  Procedure Laterality Date   BREAST LUMPECTOMY Bilateral    COLONOSCOPY  6/00   few polyps; bx neg   ESOPHAGOGASTRODUODENOSCOPY  7/00   Neg   FLEXIBLE SIGMOIDOSCOPY  3/01   Neg   TONSILLECTOMY  1998    No family history on file.  Social  History   Socioeconomic History   Marital status: Married    Spouse name: Not on file   Number of children: 1   Years of education: Not on file   Highest education level: Not on file  Occupational History   Not on file  Tobacco Use   Smoking status: Never   Smokeless tobacco: Never  Substance and Sexual Activity   Alcohol use: No    Alcohol/week: 0.0 standard drinks of alcohol   Drug use: No   Sexual activity: Not Currently  Other Topics Concern   Not on file  Social History Narrative   Married; 1 child   Social Determinants of Health   Financial Resource Strain: Not on file  Food Insecurity: Not on file  Transportation Needs: Not on file  Physical Activity: Not on file  Stress: Not on file  Social Connections: Not on file  Intimate Partner Violence: Not on file    Outpatient Medications Prior to Visit  Medication Sig Dispense Refill   ferrous sulfate 325 (65 FE) MG tablet Take 1 tablet (325 mg total) by mouth daily. (Patient not taking: Reported on 04/06/2022) 30 tablet 0   Facility-Administered Medications  Prior to Visit  Medication Dose Route Frequency Provider Last Rate Last Admin   hyaluronate sodium (RADIAPLEXRX) gel   Topical Once Thea Silversmith, MD       non-metallic deodorant Jethro Poling) 1 application  1 application  Topical Once Thea Silversmith, MD        Allergies  Allergen Reactions   Codeine Nausea Only   Erythromycin Nausea Only   Ferrous Sulfate Nausea Only   Lidocaine Other (See Comments)    Swelling in mouth    Vicodin [Hydrocodone-Acetaminophen] Nausea And Vomiting   Epinephrine Palpitations        Objective:    Physical Exam Constitutional:      General: She is not in acute distress.    Appearance: Normal appearance. She is normal weight. She is ill-appearing. She is not toxic-appearing or diaphoretic.  HENT:     Right Ear: Tympanic membrane normal.     Left Ear: Tympanic membrane normal.     Nose: Congestion and rhinorrhea present.      Right Turbinates: Not enlarged or swollen.     Left Turbinates: Not enlarged or swollen.     Right Sinus: No maxillary sinus tenderness or frontal sinus tenderness.     Left Sinus: Maxillary sinus tenderness present. No frontal sinus tenderness.     Mouth/Throat:     Mouth: Mucous membranes are moist.     Pharynx: No pharyngeal swelling, oropharyngeal exudate or posterior oropharyngeal erythema.     Tonsils: No tonsillar exudate.  Eyes:     Extraocular Movements: Extraocular movements intact.     Conjunctiva/sclera: Conjunctivae normal.     Pupils: Pupils are equal, round, and reactive to light.  Neck:     Thyroid: No thyroid mass.  Cardiovascular:     Rate and Rhythm: Normal rate and regular rhythm.  Pulmonary:     Effort: Pulmonary effort is normal. No respiratory distress.     Breath sounds: Normal breath sounds. No wheezing, rhonchi or rales.  Lymphadenopathy:     Cervical: Cervical adenopathy present.     Right cervical: Superficial cervical adenopathy present.     Left cervical: Superficial cervical adenopathy present.  Neurological:     Mental Status: She is alert.     BP 112/72   Pulse 86   Temp 98.1 F (36.7 C)   Resp 16   Ht $R'5\' 4"'Ze$  (1.626 m)   Wt 138 lb 4 oz (62.7 kg)   LMP 04/02/2022   SpO2 98%   BMI 23.73 kg/m  Wt Readings from Last 3 Encounters:  04/06/22 138 lb 4 oz (62.7 kg)  03/06/22 140 lb (63.5 kg)  04/04/20 137 lb (62.1 kg)     Health Maintenance Due  Topic Date Due   COVID-19 Vaccine (1) Never done   Hepatitis C Screening  Never done   PAP SMEAR-Modifier  07/04/2018   INFLUENZA VACCINE  Never done    There are no preventive care reminders to display for this patient.  Lab Results  Component Value Date   TSH 0.66 11/22/2016   Lab Results  Component Value Date   WBC 7.0 03/06/2022   HGB 11.5 (L) 03/06/2022   HCT 34.7 (L) 03/06/2022   MCV 85.0 03/06/2022   PLT 202 03/06/2022   Lab Results  Component Value Date   NA 139 03/06/2022    K 4.1 03/06/2022   CHLORIDE 107 02/18/2015   CO2 24 03/06/2022   GLUCOSE 114 (H) 03/06/2022   BUN 14 03/06/2022   CREATININE  0.75 03/06/2022   BILITOT 0.7 03/06/2022   ALKPHOS 58 03/06/2022   AST 18 03/06/2022   ALT 13 03/06/2022   PROT 7.2 03/06/2022   ALBUMIN 3.9 03/06/2022   CALCIUM 8.9 03/06/2022   ANIONGAP 7 03/06/2022   EGFR >90 02/18/2015   No results found for: "HGBA1C"    Assessment & Plan:   Problem List Items Addressed This Visit       Respiratory   Allergic bronchitis with acute exacerbation    rx augmentin 875/125 mg po bid x 10 days  rx prednisone 20  Take antibiotic as prescribed. Increase oral fluids. Pt to f/u if sx worsen and or fail to improve in 2-3 days.       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  Will order cxr, pending results. rx prednisone 20 mg      Relevant Orders   DG Chest 2 View   Suspected COVID-19 virus infection    covid tested and negative      Relevant Orders   POC COVID-19 BinaxNow (Completed)   POC Influenza A&B(BINAX/QUICKVUE) (Completed)   Fever - Primary    covid and flu tested today in office ,both negative.  Tylenol ibuprofen prn       Relevant Orders   POC COVID-19 BinaxNow (Completed)   POC Influenza A&B(BINAX/QUICKVUE) (Completed)   Iron deficiency anemia    Repeat cbc pending results      Relevant Orders   CBC with Differential    Meds ordered this encounter  Medications   predniSONE (DELTASONE) 20 MG tablet    Sig: Take two tablet po qd for five days    Dispense:  10 tablet    Refill:  0    Order Specific Question:   Supervising Provider    Answer:   BEDSOLE, AMY E [[id number]]   amoxicillin-clavulanate (AUGMENTIN) 875-125 MG tablet    Sig: Take 1  tablet by mouth 2 (two) times daily.    Dispense:  20 tablet    Refill:  0    Order Specific Question:   Supervising Provider    Answer:   BEDSOLE, AMY E [[id number]]    Follow-up: No follow-ups on file.    Eugenia Pancoast, FNP

## 2022-04-06 NOTE — Assessment & Plan Note (Signed)
F/u with obgyn

## 2022-04-06 NOTE — Assessment & Plan Note (Signed)
rx augmentin 875/125 mg po bid x 10 days  rx prednisone 20  Take antibiotic as prescribed. Increase oral fluids. Pt to f/u if sx worsen and or fail to improve in 2-3 days.

## 2022-04-10 ENCOUNTER — Telehealth: Payer: Self-pay | Admitting: Family Medicine

## 2022-04-10 ENCOUNTER — Encounter: Payer: Self-pay | Admitting: Family

## 2022-04-10 DIAGNOSIS — R509 Fever, unspecified: Secondary | ICD-10-CM

## 2022-04-10 DIAGNOSIS — J45901 Unspecified asthma with (acute) exacerbation: Secondary | ICD-10-CM

## 2022-04-10 MED ORDER — LEVOFLOXACIN 500 MG PO TABS: 500.0000 mg | ORAL_TABLET | Freq: Every day | ORAL | 0 refills | Status: DC

## 2022-04-10 NOTE — Telephone Encounter (Signed)
Pt did say she took 1 round of Augmentin and Prednisone and wants to know if she need to take the [place] or in the morning. I did offer an appointment for tomorrow and she said her pt load is heavy and would call in the am if she did not feel any better.

## 2022-04-10 NOTE — Telephone Encounter (Signed)
Top augmentin and prednisone Start levaquin (strong antbx) Might be good idea to get pt on schedule for tomorrow to also be seen in person.  If worsening sob or hard time breathing advise pt to go to er and or more urgent care do not wait.

## 2022-04-10 NOTE — Telephone Encounter (Signed)
See my chart message regarding symptoms Patient stated that she took one Augmentin today and one prednisone. Patient stated that she fells like the Prednisone may be helping some and want to know if she can continue that with the new antibiotic.Patient wants to know if she should hold off and start the Levaquin tomorrow. Patient stated that she will hold off on scheduling an appointment tomorrow to see how the new medication works. Patient was given ER precautions and she verbalized understanding.

## 2022-04-10 NOTE — Telephone Encounter (Signed)
Left message on voicemail for patient to call the office back. Need to get more information regarding her symptoms.

## 2022-04-10 NOTE — Progress Notes (Signed)
Just an update Saw her in office acutely, for bronchitis. Treated with augmentin and steroid 9/3.  Findings on CXR no acute findings however with bronchovascular markings, stable.  Just FYI to monitor as necessary.

## 2022-04-10 NOTE — Telephone Encounter (Signed)
Patient was seen in office on 9/1. She called in stating that she isn't feeling any better.She stated that she wheezing a lot. She would like a phone call with advice as to if she needs to come in to be seen again or if an inhaler can be called in for her?

## 2022-04-10 NOTE — Telephone Encounter (Signed)
Patient returned your call . She would like a call back.

## 2022-04-11 NOTE — Telephone Encounter (Signed)
PLEASE NOTE: All timestamps contained within this report are represented as [place] Standard Time. CONFIDENTIALTY NOTICE: This fax transmission is intended only for the addressee. It contains information that is legally privileged, confidential or otherwise protected from use or disclosure. If you are not the intended recipient, you are strictly prohibited from reviewing, disclosing, copying using or disseminating any of this information or taking any action in reliance on or regarding this information. If you have received this fax in error, please notify us immediately by telephone so that we can arrange for its return to [place]. Phone: [phone], Toll-Free: [phone], Fax: [phone] Page: 1 of 2 Call Id: [id number] [facility] - Client TELEPHONE ADVICE RECORD [facility] Patient Name: Melissa Weiss OWN Gender: Female DOB: 02/11/1981 Age: 41 Y 4 M 7 D Return Phone Number: [phone] (Primary) Address: City/ State/ Zip: [place]  [address] Client [place] [facility] - Client Client Site [place] - Day Provider Owens Loffler - MD Contact Type Call Who Is Calling Patient / Member / Family / Caregiver Call Type Triage / Clinical Relationship To Patient Self Return Phone Number [phone] (Primary) Chief Complaint Facial Swelling Reason for Call Symptomatic / Request for [place] states that she is on medications for a sinus infection. She still feels bad, and her face is swollen. Translation No Nurse Assessment Nurse: Raphael Gibney, RN, Vanita Ingles Date/Time (Eastern Time): 04/11/2022 11:00:14 AM Confirm and document reason for call. If symptomatic, describe symptoms. ---Caller states she was taking levaquin for a sinus infection which she started yesterday. has taken doxycycline and augmentin. Face is swollen. Temp 99.2. Coughing up [place] sputum Does the patient have any new  or worsening symptoms? ---Yes Will a triage be completed? ---Yes Related visit to physician within the last 2 weeks? ---Yes Does the PT have any chronic conditions? (i.e. diabetes, asthma, this includes High risk factors for pregnancy, etc.) ---Yes List chronic conditions. ---mastectomy; history of breast cancer Is the patient pregnant or possibly pregnant? (Ask all females between the ages of 62-55) ---No Is this a behavioral health or substance abuse call? ---No Guidelines Guideline Title Affirmed Question Affirmed Notes Nurse Date/Time (Eastern Time) Sinus Infection on Antibiotic Follow-up Call [1] Difficulty breathing AND [2] not from stuffy nose (e.g., not relieved by cleaning out the nose) Raphael Gibney, RN, Vanita Ingles 04/11/2022 11:03:28 AM PLEASE NOTE: All timestamps contained within this report are represented as [place] Standard Time. CONFIDENTIALTY NOTICE: This fax transmission is intended only for the addressee. It contains information that is legally privileged, confidential or otherwise protected from use or disclosure. If you are not the intended recipient, you are strictly prohibited from reviewing, disclosing, copying using or disseminating any of this information or taking any action in reliance on or regarding this information. If you have received this fax in error, please notify us immediately by telephone so that we can arrange for its return to [place]. Phone: [phone], Toll-Free: [phone], Fax: [phone] Page: 2 of 2 Call Id: [id number] [place]. Time Eilene Ghazi Time) Disposition Final User 04/11/2022 11:10:13 AM Go to ED Now Yes Raphael Gibney, RN, Vanita Ingles Final Disposition 04/11/2022 11:10:13 AM Go to ED Now Yes Raphael Gibney, RN, Doreatha Lew Disagree/Comply Comply Caller Understands Yes PreDisposition Did not know what to do Care Advice Given Per Guideline GO TO ED NOW: * You need to be seen in the Emergency Department. ANOTHER ADULT SHOULD DRIVE: * It is better and safer if  another adult drives instead of you.  CARE ADVICE given per Sinus Infection on Antibiotic Follow-Up Call (Adult) guideline. Referrals [facility] - ED

## 2022-04-11 NOTE — Telephone Encounter (Signed)
Access Nurse note shows that patient is going to [facility] ER.

## 2022-04-11 NOTE — Telephone Encounter (Signed)
Patient called and stated her face is swollen and also not feeling well, and has taken new antibiotics. Patient was sent to access nurse for new symptom.

## 2022-04-13 ENCOUNTER — Other Ambulatory Visit: Payer: Self-pay

## 2022-04-13 ENCOUNTER — Emergency Department: Payer: Managed Care, Other (non HMO)

## 2022-04-13 ENCOUNTER — Inpatient Hospital Stay
Admission: EM | Admit: 2022-04-13 | Discharge: 2022-04-16 | DRG: 871 | Disposition: A | Discharge status: Home / Self Care | Payer: Managed Care, Other (non HMO) | Attending: Internal Medicine | Admitting: Internal Medicine

## 2022-04-13 DIAGNOSIS — F411 Generalized anxiety disorder: Secondary | ICD-10-CM | POA: Diagnosis present

## 2022-04-13 DIAGNOSIS — R0602 Shortness of breath: Secondary | ICD-10-CM

## 2022-04-13 DIAGNOSIS — Z888 Allergy status to other drugs, medicaments and biological substances status: Secondary | ICD-10-CM

## 2022-04-13 DIAGNOSIS — Z20822 Contact with and (suspected) exposure to covid-19: Secondary | ICD-10-CM | POA: Diagnosis present

## 2022-04-13 DIAGNOSIS — J189 Pneumonia, unspecified organism: Secondary | ICD-10-CM | POA: Diagnosis not present

## 2022-04-13 DIAGNOSIS — A419 Sepsis, unspecified organism: Secondary | ICD-10-CM | POA: Diagnosis not present

## 2022-04-13 DIAGNOSIS — Z923 Personal history of irradiation: Secondary | ICD-10-CM

## 2022-04-13 DIAGNOSIS — Z853 Personal history of malignant neoplasm of breast: Secondary | ICD-10-CM

## 2022-04-13 DIAGNOSIS — Z881 Allergy status to other antibiotic agents status: Secondary | ICD-10-CM

## 2022-04-13 DIAGNOSIS — Z8719 Personal history of other diseases of the digestive system: Secondary | ICD-10-CM

## 2022-04-13 DIAGNOSIS — Z800 Family history of malignant neoplasm of digestive organs: Secondary | ICD-10-CM

## 2022-04-13 DIAGNOSIS — Z885 Allergy status to narcotic agent status: Secondary | ICD-10-CM

## 2022-04-13 DIAGNOSIS — Z9011 Acquired absence of right breast and nipple: Secondary | ICD-10-CM

## 2022-04-13 LAB — RESP PANEL BY RT-PCR (FLU A&B, COVID) ARPGX2
Influenza A by PCR: NEGATIVE
Influenza B by PCR: NEGATIVE
SARS Coronavirus 2 by RT PCR: NEGATIVE

## 2022-04-13 LAB — CBC
HCT: 40.9 % (ref 36.0–46.0)
Hemoglobin: 13.6 g/dL (ref 12.0–15.0)
MCH: 28.4 pg (ref 26.0–34.0)
MCHC: 33.3 g/dL (ref 30.0–36.0)
MCV: 85.4 fL (ref 80.0–100.0)
Platelets: 326 10*3/uL (ref 150–400)
RBC: 4.79 MIL/uL (ref 3.87–5.11)
RDW: 14.2 % (ref 11.5–15.5)
WBC: 8.5 10*3/uL (ref 4.0–10.5)
nRBC: 0 % (ref 0.0–0.2)

## 2022-04-13 LAB — BASIC METABOLIC PANEL
Anion gap: 8 (ref 5–15)
BUN: 11 mg/dL (ref 6–20)
CO2: 29 mmol/L (ref 22–32)
Calcium: 9.3 mg/dL (ref 8.9–10.3)
Chloride: 101 mmol/L (ref 98–111)
Creatinine, Ser: 0.76 mg/dL (ref 0.44–1.00)
GFR, Estimated: >60 mL/min (ref >60)
Glucose, Bld: 101 mg/dL — ABNORMAL HIGH (ref 70–99)
Potassium: 4.2 mmol/L (ref 3.5–5.1)
Sodium: 138 mmol/L (ref 135–145)

## 2022-04-13 LAB — LACTIC ACID, PLASMA: Lactic Acid, Venous: 1.5 mmol/L (ref 0.5–1.9)

## 2022-04-13 LAB — TROPONIN I (HIGH SENSITIVITY): Troponin I (High Sensitivity): <2 ng/L (ref <18)

## 2022-04-13 MED ORDER — DEXAMETHASONE SODIUM PHOSPHATE 10 MG/ML IJ SOLN
10.0000 mg | Freq: Once | INTRAMUSCULAR | Status: AC
  Administered 2022-04-13: 10 mg via INTRAVENOUS
  Filled 2022-04-13: qty 1

## 2022-04-13 MED ORDER — IOHEXOL 350 MG/ML SOLN
75.0000 mL | Freq: Once | INTRAVENOUS | Status: AC | PRN
  Administered 2022-04-13: 75 mL via INTRAVENOUS

## 2022-04-13 MED ORDER — SODIUM CHLORIDE 0.9 % IV BOLUS
1000.0000 mL | Freq: Once | INTRAVENOUS | Status: AC
  Administered 2022-04-13: 1000 mL via INTRAVENOUS

## 2022-04-13 MED ORDER — ONDANSETRON HCL 4 MG/2ML IJ SOLN
4.0000 mg | Freq: Once | INTRAMUSCULAR | Status: AC
  Administered 2022-04-13: 4 mg via INTRAVENOUS
  Filled 2022-04-13: qty 2

## 2022-04-13 MED ORDER — IPRATROPIUM-ALBUTEROL 0.5-2.5 (3) MG/3ML IN SOLN
3.0000 mL | Freq: Once | RESPIRATORY_TRACT | Status: AC
  Administered 2022-04-13: 3 mL via RESPIRATORY_TRACT
  Filled 2022-04-13: qty 3

## 2022-04-13 MED ORDER — SODIUM CHLORIDE 0.9 % IV SOLN
500.0000 mg | Freq: Once | INTRAVENOUS | Status: AC
  Administered 2022-04-13: 500 mg via INTRAVENOUS
  Filled 2022-04-13: qty 5

## 2022-04-13 NOTE — ED Triage Notes (Addendum)
Pt arrives with c/o SOB that has been going on for about 4 weeks. Per pt, she has been treated with 4 different antibiotics for URI without relief. Pt sent here from PCP for workup and IV antibiotics.

## 2022-04-13 NOTE — ED Provider Notes (Signed)
[facility] Emergency Department Provider Note     Event Date/Time   First MD Initiated Contact with Patient 04/13/22 1927     (approximate)   History   Shortness of Breath   HPI  Melissa Weiss is a 41 y.o. female with a history of right breast cancer status postmastectomy, colitis, anxiety, presents to the ED for 4 weeks of persistent and worsening shortness of breath.  Patient has been evaluated by her primary provider and local urgent care over the last 4 weeks and has completed 2 prior courses of antibiotics including Augmentin, Cipro, and currently on Levaquin.  She presents today at the advice of her PCP, noting recurrent low-grade fevers with a Tmax of 100.4 F, as well as productive cough, dyspnea and fatigue.      Physical Exam   Triage Vital Signs: ED Triage Vitals  Enc Vitals Group     BP 04/13/22 1716 (!) 136/102     Pulse Rate 04/13/22 1716 98     Resp 04/13/22 1716 16     Temp 04/13/22 1716 98.5 F (36.9 C)     Temp Source 04/13/22 1716 Oral     SpO2 04/13/22 1716 100 %     Weight 04/13/22 1720 138 lb (62.6 kg)     Height --      Head Circumference --      Peak Flow --      Pain Score 04/13/22 1720 8     Pain Loc --      Pain Edu? --      Excl. in [place]? --     Most recent vital signs: Vitals:   04/13/22 2200 04/13/22 2235  BP: 116/86   Pulse: (!) 108   Resp: (!) 23   Temp:  98.2 F (36.8 C)  SpO2: 100%     General Awake, no distress.  HEENT NCAT. PERRL. EOMI. No rhinorrhea. Mucous membranes are moist.  CV:  Good peripheral perfusion. Tachy rate RESP:  Normal effort. Right lung rhonchi noted, intermittent wheeze and cough during evaluation ABD:  No distention.   ED Results / Procedures / Treatments   Labs (all labs ordered are listed, but only abnormal results are displayed) Labs Reviewed  BASIC METABOLIC PANEL - Abnormal; Notable for the following components:      Result Value   Glucose, Bld 101 (*)    All  other components within normal limits  RESP PANEL BY RT-PCR (FLU A&B, COVID) ARPGX2  CBC  LACTIC ACID, PLASMA  POC URINE PREG, ED  TROPONIN I (HIGH SENSITIVITY)     EKG  ***  RADIOLOGY  {**I personally viewed and evaluated these images as part of my medical decision making, as well as reviewing the written report by the radiologist.  ED Provider Interpretation: ***}  CT Angio Chest PE W and/or Wo Contrast  Result Date: 04/13/2022 CLINICAL DATA:  Chest pain and shortness of breath. EXAM: CT ANGIOGRAPHY CHEST WITH CONTRAST TECHNIQUE: Multidetector CT imaging of the chest was performed using the standard protocol during bolus administration of intravenous contrast. Multiplanar CT image reconstructions and MIPs were obtained to evaluate the vascular anatomy. RADIATION DOSE REDUCTION: This exam was performed according to the departmental dose-optimization program which includes automated exposure control, adjustment of the mA and/or kV according to patient size and/or use of iterative reconstruction technique. CONTRAST:  2m OMNIPAQUE IOHEXOL 350 MG/ML SOLN COMPARISON:  CT of the chest 04/12/2011 FINDINGS: Cardiovascular: Satisfactory opacification of the pulmonary  arteries to the segmental level. No evidence of pulmonary embolism. Normal heart size. No pericardial effusion. Mediastinum/Nodes: No enlarged mediastinal, hilar, or axillary lymph nodes. Thyroid gland, trachea, and esophagus demonstrate no significant findings. Lungs/Pleura: There is some ill-defined patchy airspace and nodular densities clustered in the right lower lobe and minimally in the right upper lobe measuring up to 9 mm. There is no pleural effusion or pneumothorax. Lungs are otherwise clear. Upper Abdomen: Punctate nonobstructing renal calculi. Musculoskeletal: Right mastectomy changes are present. No acute fracture or focal osseous lesion. Review of the MIP images confirms the above findings. IMPRESSION: 1. Patchy ill-defined  nodular and airspace opacities in the right lower lobe and minimally in the right upper lobe, favored as infectious/inflammatory. Follow-up chest CT recommended in 3 months to re-evaluate. 2. Nonobstructing renal calculi. Electronically Signed   By: Ronney Asters M.D.   On: 04/13/2022 21:27   DG Chest 2 View  Result Date: 04/13/2022 CLINICAL DATA:  Shortness of breath for 4 weeks, treated with 4 different antibiotics without relief, history of RIGHT breast cancer and mastectomy EXAM: CHEST - 2 VIEW COMPARISON:  04/06/2022 FINDINGS: Normal heart size, mediastinal contours, and pulmonary vascularity. Lungs clear. No pulmonary infiltrate, pleural effusion, or pneumothorax. Prior RIGHT mastectomy. No acute osseous findings. IMPRESSION: No acute abnormalities. Electronically Signed   By: Lavonia Dana M.D.   On: 04/13/2022 17:45     PROCEDURES:  Critical Care performed: {CriticalCareYesNo:19197::"Yes, see critical care procedure note(s)","No"}  Procedures   MEDICATIONS ORDERED IN ED: Medications  dexamethasone (DECADRON) injection 10 mg (has no administration in time range)  ipratropium-albuterol (DUONEB) 0.5-2.5 (3) MG/3ML nebulizer solution 3 mL (has no administration in time range)  azithromycin (ZITHROMAX) 500 mg in sodium chloride 0.9 % 250 mL IVPB (has no administration in time range)  ondansetron (ZOFRAN) injection 4 mg (has no administration in time range)  ipratropium-albuterol (DUONEB) 0.5-2.5 (3) MG/3ML nebulizer solution 3 mL (3 mLs Nebulization Given 04/13/22 2027)  sodium chloride 0.9 % bolus 1,000 mL (0 mLs Intravenous Stopped 04/13/22 2243)  iohexol (OMNIPAQUE) 350 MG/ML injection 75 mL (75 mLs Intravenous Contrast Given 04/13/22 2104)     IMPRESSION / MDM / ASSESSMENT AND PLAN / ED COURSE  I reviewed the triage vital signs and the nursing notes.                              Differential diagnosis includes, but is not limited to, viral syndrome, bronchitis including COPD  exacerbation, pneumonia, reactive airway disease including asthma, CHF including exacerbation with or without pulmonary/interstitial edema, pneumothorax, ACS, thoracic trauma, and pulmonary embolism.   Patient's presentation is most consistent with acute complicated illness / injury requiring diagnostic workup.  The patient is on the cardiac monitor to evaluate for evidence of arrhythmia and/or significant heart rate changes  Patient's diagnosis is consistent with ***. Patient will be discharged home with prescriptions for ***. Patient is to follow up with *** as needed or otherwise directed. Patient is given ED precautions to return to the ED for any worsening or new symptoms.     FINAL CLINICAL IMPRESSION(S) / ED DIAGNOSES   Final diagnoses:  Community acquired pneumonia of right lower lobe of lung  SOB (shortness of breath)     Rx / DC Orders   ED Discharge Orders     None        Note:  This document was prepared using Dragon voice recognition software and may  include unintentional dictation errors.

## 2022-04-14 DIAGNOSIS — Z885 Allergy status to narcotic agent status: Secondary | ICD-10-CM | POA: Diagnosis not present

## 2022-04-14 DIAGNOSIS — Z881 Allergy status to other antibiotic agents status: Secondary | ICD-10-CM | POA: Diagnosis not present

## 2022-04-14 DIAGNOSIS — Z888 Allergy status to other drugs, medicaments and biological substances status: Secondary | ICD-10-CM | POA: Diagnosis not present

## 2022-04-14 DIAGNOSIS — J189 Pneumonia, unspecified organism: Secondary | ICD-10-CM | POA: Diagnosis not present

## 2022-04-14 DIAGNOSIS — Z923 Personal history of irradiation: Secondary | ICD-10-CM | POA: Diagnosis not present

## 2022-04-14 DIAGNOSIS — Z853 Personal history of malignant neoplasm of breast: Secondary | ICD-10-CM | POA: Diagnosis not present

## 2022-04-14 DIAGNOSIS — Z9011 Acquired absence of right breast and nipple: Secondary | ICD-10-CM | POA: Diagnosis not present

## 2022-04-14 DIAGNOSIS — F419 Anxiety disorder, unspecified: Secondary | ICD-10-CM | POA: Diagnosis not present

## 2022-04-14 DIAGNOSIS — A419 Sepsis, unspecified organism: Principal | ICD-10-CM

## 2022-04-14 DIAGNOSIS — Z20822 Contact with and (suspected) exposure to covid-19: Secondary | ICD-10-CM | POA: Diagnosis present

## 2022-04-14 DIAGNOSIS — Z8719 Personal history of other diseases of the digestive system: Secondary | ICD-10-CM | POA: Diagnosis not present

## 2022-04-14 DIAGNOSIS — F411 Generalized anxiety disorder: Secondary | ICD-10-CM | POA: Diagnosis present

## 2022-04-14 DIAGNOSIS — Z800 Family history of malignant neoplasm of digestive organs: Secondary | ICD-10-CM | POA: Diagnosis not present

## 2022-04-14 LAB — RESPIRATORY PANEL BY PCR

## 2022-04-14 LAB — BASIC METABOLIC PANEL
Anion gap: 8 (ref 5–15)
BUN: 8 mg/dL (ref 6–20)
CO2: 26 mmol/L (ref 22–32)
Calcium: 9.1 mg/dL (ref 8.9–10.3)
Chloride: 104 mmol/L (ref 98–111)
Creatinine, Ser: 0.73 mg/dL (ref 0.44–1.00)
GFR, Estimated: >60 mL/min (ref >60)
Glucose, Bld: 166 mg/dL — ABNORMAL HIGH (ref 70–99)
Potassium: 4.1 mmol/L (ref 3.5–5.1)
Sodium: 138 mmol/L (ref 135–145)

## 2022-04-14 LAB — PROTIME-INR
INR: 1.2 (ref 0.8–1.2)
Prothrombin Time: 15.1 seconds (ref 11.4–15.2)

## 2022-04-14 LAB — CBC
HCT: 37.2 % (ref 36.0–46.0)
Hemoglobin: 12.4 g/dL (ref 12.0–15.0)
MCH: 28.5 pg (ref 26.0–34.0)
MCHC: 33.3 g/dL (ref 30.0–36.0)
MCV: 85.5 fL (ref 80.0–100.0)
Platelets: 299 10*3/uL (ref 150–400)
RBC: 4.35 MIL/uL (ref 3.87–5.11)
RDW: 14 % (ref 11.5–15.5)
WBC: 7.2 10*3/uL (ref 4.0–10.5)
nRBC: 0 % (ref 0.0–0.2)

## 2022-04-14 LAB — EXPECTORATED SPUTUM ASSESSMENT W GRAM STAIN, RFLX TO RESP C

## 2022-04-14 LAB — CORTISOL-AM, BLOOD: Cortisol - AM: 1.6 ug/dL — ABNORMAL LOW (ref 6.7–22.6)

## 2022-04-14 LAB — PROCALCITONIN: Procalcitonin: <0.1 ng/mL

## 2022-04-14 LAB — STREP PNEUMONIAE URINARY ANTIGEN: Strep Pneumo Urinary Antigen: NEGATIVE

## 2022-04-14 MED ORDER — GUAIFENESIN ER 600 MG PO TB12
600.0000 mg | ORAL_TABLET | Freq: Two times a day (BID) | ORAL | Status: DC
  Administered 2022-04-14 – 2022-04-16 (×5): 600 mg via ORAL
  Filled 2022-04-14 (×6): qty 1

## 2022-04-14 MED ORDER — OXYCODONE-ACETAMINOPHEN 5-325 MG PO TABS
1.0000 | ORAL_TABLET | Freq: Four times a day (QID) | ORAL | Status: DC | PRN
  Filled 2022-04-14: qty 1

## 2022-04-14 MED ORDER — ACETAMINOPHEN 650 MG RE SUPP: 650.0000 mg | Freq: Four times a day (QID) | RECTAL | Status: DC | PRN

## 2022-04-14 MED ORDER — SODIUM CHLORIDE 0.9 % IV SOLN
2.0000 g | Freq: Every day | INTRAVENOUS | Status: DC
  Administered 2022-04-14 – 2022-04-16 (×3): 2 g via INTRAVENOUS
  Filled 2022-04-14: qty 20
  Filled 2022-04-14 (×2): qty 2

## 2022-04-14 MED ORDER — PANTOPRAZOLE SODIUM 40 MG PO TBEC
40.0000 mg | DELAYED_RELEASE_TABLET | Freq: Every day | ORAL | Status: DC
  Administered 2022-04-14: 40 mg via ORAL
  Filled 2022-04-14 (×3): qty 1

## 2022-04-14 MED ORDER — ALPRAZOLAM 0.25 MG PO TABS
0.2500 mg | ORAL_TABLET | Freq: Two times a day (BID) | ORAL | Status: DC | PRN
  Administered 2022-04-14 – 2022-04-15 (×3): 0.25 mg via ORAL
  Filled 2022-04-14 (×3): qty 1

## 2022-04-14 MED ORDER — SODIUM CHLORIDE 0.9 % IV SOLN
500.0000 mg | INTRAVENOUS | Status: DC
  Administered 2022-04-14 – 2022-04-15 (×2): 500 mg via INTRAVENOUS
  Filled 2022-04-14: qty 500
  Filled 2022-04-14: qty 5

## 2022-04-14 MED ORDER — ALUM & MAG HYDROXIDE-SIMETH 200-200-20 MG/5ML PO SUSP
30.0000 mL | Freq: Once | ORAL | Status: AC
  Administered 2022-04-14: 30 mL via ORAL
  Filled 2022-04-14: qty 30

## 2022-04-14 MED ORDER — ONDANSETRON HCL 4 MG PO TABS
4.0000 mg | ORAL_TABLET | Freq: Four times a day (QID) | ORAL | Status: DC | PRN
  Administered 2022-04-15: 4 mg via ORAL
  Filled 2022-04-14: qty 1

## 2022-04-14 MED ORDER — MORPHINE SULFATE (PF) 2 MG/ML IV SOLN
1.0000 mg | INTRAVENOUS | Status: DC | PRN
  Administered 2022-04-14 (×2): 1 mg via INTRAVENOUS
  Filled 2022-04-14 (×2): qty 1

## 2022-04-14 MED ORDER — ENOXAPARIN SODIUM 40 MG/0.4ML IJ SOSY
40.0000 mg | PREFILLED_SYRINGE | INTRAMUSCULAR | Status: DC
  Administered 2022-04-15: 40 mg via SUBCUTANEOUS
  Filled 2022-04-14 (×2): qty 0.4

## 2022-04-14 MED ORDER — HYOSCYAMINE SULFATE 0.125 MG SL SUBL
0.2500 mg | SUBLINGUAL_TABLET | Freq: Once | SUBLINGUAL | Status: DC
  Filled 2022-04-14: qty 2

## 2022-04-14 MED ORDER — ONDANSETRON HCL 4 MG/2ML IJ SOLN
4.0000 mg | Freq: Four times a day (QID) | INTRAMUSCULAR | Status: DC | PRN
  Administered 2022-04-14: 4 mg via INTRAVENOUS
  Filled 2022-04-14: qty 2

## 2022-04-14 MED ORDER — ACETAMINOPHEN 325 MG PO TABS: 650.0000 mg | ORAL_TABLET | Freq: Four times a day (QID) | ORAL | Status: DC | PRN

## 2022-04-14 MED ORDER — TRAZODONE HCL 50 MG PO TABS
25.0000 mg | ORAL_TABLET | Freq: Every evening | ORAL | Status: DC | PRN
  Administered 2022-04-14: 25 mg via ORAL
  Filled 2022-04-14: qty 1

## 2022-04-14 MED ORDER — IPRATROPIUM-ALBUTEROL 0.5-2.5 (3) MG/3ML IN SOLN
3.0000 mL | Freq: Four times a day (QID) | RESPIRATORY_TRACT | Status: DC
  Administered 2022-04-14 – 2022-04-16 (×8): 3 mL via RESPIRATORY_TRACT
  Filled 2022-04-14 (×10): qty 3

## 2022-04-14 MED ORDER — SODIUM CHLORIDE 0.9 % IV SOLN: INTRAVENOUS | Status: DC | PRN

## 2022-04-14 NOTE — Assessment & Plan Note (Signed)
-   See status post left right lumpectomy followed by mastectomy with recurrence as mentioned above.

## 2022-04-14 NOTE — Progress Notes (Signed)
   04/14/22 2025  Assess: MEWS Score  Temp 98.6 F (37 C)  BP (!) 127/92  MAP (mmHg) 102  Pulse Rate (!) 120 (Reported to RN Nurse.)  Resp 16  SpO2 99 %  O2 Device Room Air  Assess: MEWS Score  MEWS Temp 0  MEWS Systolic 0  MEWS Pulse 2  MEWS RR 0  MEWS LOC 0  MEWS Score 2  MEWS Score Color Yellow  Assess: if the MEWS score is Yellow or Red  Were vital signs taken at a resting state? Yes  Focused Assessment No change from prior assessment  Does the patient meet 2 or more of the SIRS criteria? No  MEWS guidelines implemented *See Row Information* Yes  Treat  MEWS Interventions Administered scheduled meds/treatments;Administered prn meds/treatments  Take Vital Signs  Increase Vital Sign Frequency  Yellow: Q 2hr X 2 then Q 4hr X 2, if remains yellow, continue Q 4hrs  Escalate  MEWS: Escalate Yellow: discuss with charge nurse/RN and consider discussing with provider and RRT  Notify: Charge Nurse/RN  Name of Charge Nurse/RN Notified Almyra Free RN  Date Charge Nurse/RN Notified 04/14/22  Time Charge Nurse/RN Notified 2030  Document  Patient Outcome Stabilized after interventions  Progress note created (see row info) Yes  Assess: SIRS CRITERIA  SIRS Temperature  0  SIRS Pulse 1  SIRS Respirations  0  SIRS WBC 1  SIRS Score Sum  2   Pt has converted to [employer]

## 2022-04-14 NOTE — H&P (Signed)
[place]   PATIENT NAME: Melissa Weiss    MR#:  [id number]  DATE OF BIRTH:  [birth year]  DATE OF ADMISSION:  04/13/2022  PRIMARY CARE PHYSICIAN: Owens Loffler, MD   Patient is coming from: Home  REQUESTING/REFERRING PHYSICIAN: Menshew, Dannielle Karvonen, PA  CHIEF COMPLAINT:   Chief Complaint  Patient presents with   Shortness of Breath    HISTORY OF PRESENT ILLNESS:  Melissa Weiss is a 41 y.o. female with medical history significant for breast cancer status post right breast lumpectomy followed by recurrence in 7 years and right mastectomy, who presented to the emergency room with acute onset of worsening dyspnea with associated cough productive of thick yellow sputum as well as wheezing which have been going on over the last 4 weeks.  Though she was seen on an outpatient basis and prescribed the course of p.o. doxycycline followed by Augmentin as well as p.o. prednisone and for the last 3 days she was given 500 mg of p.o. Levaquin.  She had a Tmax at home of 99.8 and today 100.4.  She denied any chest pain or palpitations.  She has been having persistent symptoms and significant fatigue and tiredness.  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  She then presented to the ER.  She denies any dysuria, oliguria or hematuria or flank pain.  No nausea or vomiting or abdominal pain.  ED Course: Upon presenting to the emergency room, BP was 136/102 and later 122/89 with heart rate of 98 and later 108 with respiratory rate that was normal later 27.  Labs revealed unremarkable BMP.  High sensitive troponin I was less than 2 and lactic acid 1.5 with normal CBC.  Influenza antigens and COVID-19 PCR came back negative.  Imaging: Two-view chest x-ray showed no acute cardiopulmonary disease.  Chest CTA revealed patchy ill-defined nodular airspace opacities in the right lower lobe and minimally in the right upper lobe favoring  infectious/inflammatory etiology.  EKG as reviewed by me : EKG showed normal sinus rhythm with a rate of 95.  The patient was given IV Zithromax and added IV Rocephin, Decadron and 2 DuoNebs as well as 4 mg of IV Zofran and 1 L bolus of IV normal saline.  She will be admitted to a medical telemetry bed for further evaluation and management.  PAST MEDICAL HISTORY:   Past Medical History:  Diagnosis Date   Anxiety state, unspecified    Breast cancer of upper-outer quadrant of right female breast ([place]) 03/09/2013   Colitis    Colon polyp    benign   Folliculitis 7/00   pseudomonas   H/O urticaria    Lipoma of unspecified site    Neoplasm of unspecified nature of bone, soft tissue, and skin    Ovarian cyst 2000   S/P radiation therapy 04/28/2013-06/11/2013   Right breast / 45 Gray @ 1.8 [place] [employer] per fraction x 25 fractions/Right breast boost / 16 Gray at [employer] per fraction x 8 fractions     PAST SURGICAL HISTORY:   Past Surgical History:  Procedure Laterality Date   BREAST LUMPECTOMY Bilateral    COLONOSCOPY  6/00   few polyps; bx neg   ESOPHAGOGASTRODUODENOSCOPY  7/00   Neg   FLEXIBLE SIGMOIDOSCOPY  3/01   Neg   TONSILLECTOMY  1998    SOCIAL HISTORY:   Social History   Tobacco Use   Smoking  status: Never   Smokeless tobacco: Never  Substance Use Topics   Alcohol use: No    Alcohol/week: 0.0 standard drinks of alcohol    FAMILY HISTORY:   Positive for appendiceal cancer in her mother.  DRUG ALLERGIES:   Allergies  Allergen Reactions   Codeine Nausea Only   Erythromycin Nausea Only   Ferrous Sulfate Nausea Only   Lidocaine Other (See Comments)    Swelling in mouth    Vicodin [Hydrocodone-Acetaminophen] Nausea And Vomiting   Epinephrine Palpitations    REVIEW OF SYSTEMS:   ROS As per history of present illness. All pertinent systems were reviewed above. Constitutional, HEENT, cardiovascular, respiratory, GI, GU, musculoskeletal, neuro, psychiatric, endocrine,  integumentary and hematologic systems were reviewed and are otherwise negative/unremarkable except for positive findings mentioned above in the HPI.   MEDICATIONS AT HOME:   Prior to Admission medications   Medication Sig Start Date End Date Taking? Authorizing Provider  amoxicillin-clavulanate (AUGMENTIN) 875-125 MG tablet Take 1 tablet by mouth 2 (two) times daily. 04/06/22   Eugenia Pancoast, FNP  levofloxacin (LEVAQUIN) 500 MG tablet Take 1 tablet (500 mg total) by mouth daily for 7 days. 04/10/22 04/17/22  Eugenia Pancoast, FNP  predniSONE (DELTASONE) 20 MG tablet Take two tablet po qd for five days 04/06/22   Eugenia Pancoast, FNP      VITAL SIGNS:  Blood pressure 124/79, pulse (!) 108, temperature 98.1 F (36.7 C), temperature source Oral, resp. rate 20, height '5\' 4"'$  (1.626 m), weight 63 kg, last menstrual period 04/02/2022, SpO2 98 %.  PHYSICAL EXAMINATION:  Physical Exam  GENERAL:  41 y.o.-year-old Caucasian female patient lying in the bed with no acute distress.  EYES: Pupils equal, round, reactive to light and accommodation. No scleral icterus. Extraocular muscles intact.  HEENT: Head atraumatic, normocephalic. Oropharynx and nasopharynx clear.  NECK:  Supple, no jugular venous distention. No thyroid enlargement, no tenderness.  LUNGS: Diminished right basal and mid lung zone breath sounds with associated crackles.. No use of accessory muscles of respiration.  CARDIOVASCULAR: Regular rate and rhythm, S1, S2 normal. No murmurs, rubs, or gallops.  ABDOMEN: Soft, nondistended, nontender. Bowel sounds present. No organomegaly or mass.  EXTREMITIES: No pedal edema, cyanosis, or clubbing.  NEUROLOGIC: Cranial nerves II through XII are intact. Muscle strength 5/5 in all extremities. Sensation intact. Gait not checked.  PSYCHIATRIC: The patient is alert and oriented x 3.  Normal affect and good eye contact. SKIN: No obvious rash, lesion, or ulcer.   LABORATORY PANEL:   CBC Recent Labs  Lab  04/13/22 1731  WBC 8.5  HGB 13.6  HCT 40.9  PLT 326   ------------------------------------------------------------------------------------------------------------------  Chemistries  Recent Labs  Lab 04/13/22 1731  NA 138  K 4.2  CL 101  CO2 29  GLUCOSE 101*  BUN 11  CREATININE 0.76  CALCIUM 9.3   ------------------------------------------------------------------------------------------------------------------  Cardiac Enzymes No results for input(s): "TROPONINI" in the last 168 hours. ------------------------------------------------------------------------------------------------------------------  RADIOLOGY:  CT Angio Chest PE W and/or Wo Contrast  Result Date: 04/13/2022 CLINICAL DATA:  Chest pain and shortness of breath. EXAM: CT ANGIOGRAPHY CHEST WITH CONTRAST TECHNIQUE: Multidetector CT imaging of the chest was performed using the standard protocol during bolus administration of intravenous contrast. Multiplanar CT image reconstructions and MIPs were obtained to evaluate the vascular anatomy. RADIATION DOSE REDUCTION: This exam was performed according to the departmental dose-optimization program which includes automated exposure control, adjustment of the mA and/or kV according to patient size and/or use of iterative reconstruction technique.  CONTRAST:  54m OMNIPAQUE IOHEXOL 350 MG/ML SOLN COMPARISON:  CT of the chest 04/12/2011 FINDINGS: Cardiovascular: Satisfactory opacification of the pulmonary arteries to the segmental level. No evidence of pulmonary embolism. Normal heart size. No pericardial effusion. Mediastinum/Nodes: No enlarged mediastinal, hilar, or axillary lymph nodes. Thyroid gland, trachea, and esophagus demonstrate no significant findings. Lungs/Pleura: There is some ill-defined patchy airspace and nodular densities clustered in the right lower lobe and minimally in the right upper lobe measuring up to 9 mm. There is no pleural effusion or pneumothorax. Lungs are  otherwise clear. Upper Abdomen: Punctate nonobstructing renal calculi. Musculoskeletal: Right mastectomy changes are present. No acute fracture or focal osseous lesion. Review of the MIP images confirms the above findings. IMPRESSION: 1. Patchy ill-defined nodular and airspace opacities in the right lower lobe and minimally in the right upper lobe, favored as infectious/inflammatory. Follow-up chest CT recommended in 3 months to re-evaluate. 2. Nonobstructing renal calculi. Electronically Signed   By: ARonney AstersM.D.   On: 04/13/2022 21:27   DG Chest 2 View  Result Date: 04/13/2022 CLINICAL DATA:  Shortness of breath for 4 weeks, treated with 4 different antibiotics without relief, history of RIGHT breast cancer and mastectomy EXAM: CHEST - 2 VIEW COMPARISON:  04/06/2022 FINDINGS: Normal heart size, mediastinal contours, and pulmonary vascularity. Lungs clear. No pulmonary infiltrate, pleural effusion, or pneumothorax. Prior RIGHT mastectomy. No acute osseous findings. IMPRESSION: No acute abnormalities. Electronically Signed   By: MLavonia DanaM.D.   On: 04/13/2022 17:45      IMPRESSION AND PLAN:  Assessment and Plan: * Sepsis due to pneumonia ([facility] - This is sepsis as manifested by tachycardia and tachypnea in the setting of community-acquired pneumonia failing outpatient antibiotic therapy.  I suspect bacterial etiology. - She will be admitted to a medical telemetry bed. - We will continue antibiotic therapy with IV Rocephin and Zithromax. - Mucolytic therapy and bronchodilator therapy will be provided. - We will follow blood and sputum cultures.  History of breast cancer in female - See status post left right lumpectomy followed by mastectomy with recurrence as mentioned above.       DVT prophylaxis: Lovenox.  Advanced Care Planning:  Code Status: full code.  Family Communication:  The plan of care was discussed in details with the patient (and family). I answered all questions. The  patient agreed to proceed with the above mentioned plan. Further management will depend upon hospital course. Disposition Plan: Back to previous home environment Consults called: none.  All the records are reviewed and case discussed with ED provider.  Status is: Inpatient   At the time of the admission, it appears that the appropriate admission status for this patient is inpatient.  This is judged to be reasonable and necessary in order to provide the required intensity of service to ensure the patient's safety given the presenting symptoms, physical exam findings and initial radiographic and laboratory data in the context of comorbid conditions.  The patient requires inpatient status due to high intensity of service, high risk of further deterioration and high frequency of surveillance required.  I certify that at the time of admission, it is my clinical judgment that the patient will require inpatient hospital care extending more than 2 midnights.                            Dispo: The patient is from: Home  Anticipated d/c is to: Home              Patient currently is not medically stable to d/c.              Difficult to place patient: No  Christel Mormon M.D on 04/14/2022 at 5:14 AM  [facility]   From 7 PM-7 AM, contact night-coverage [email or web address]  CC: Primary care physician; Owens Loffler, MD

## 2022-04-14 NOTE — Progress Notes (Signed)
PROGRESS NOTE    Melissa Weiss  BJS:[id number] DOB: 01/23/1981 DOA: 04/13/2022 PCP: Owens Loffler, MD   Assessment & Plan:   Principal Problem:   Sepsis due to pneumonia [facility]) Active Problems:   History of breast cancer in female  Assessment and Plan: Sepsis: met criteria w/ tachycardia, tachypnea & pneumonia. Continue on IV rocephin, azithromycin, bronchodilators  CAP: continue on IV rocephin, azithromcyin, bronchodilators & encourage incentive spirometry. Failed outpatient doxycycline. Legionella, strep, mycoplasma & respiratory viral panel ordered. Sputum cx ordered  Hx of breast cancer: s/p lumpectomy then mastectomy. Management per onco outpatient         DVT prophylaxis: lovenox Code Status: full  Family Communication: discussed pt's care w/ pt's family at bedside and answered their questions Disposition Plan: likely d/c back home   Level of care: Telemetry Medical  Status is: Inpatient Remains inpatient appropriate because: severity of illness    Consultants:    Procedures:   Antimicrobials: rocephin, azithromycin    Subjective: Pt c/o feeling like her "chest is on fire."  Objective: Vitals:   04/13/22 2200 04/13/22 2235 04/14/22 0149 04/14/22 0216  BP: 116/86  113/84 124/79  Pulse: (!) 108  (!) 106 (!) 108  Resp: (!) $RemoveB'23  16 20  'YqtWEwVn$ Temp:  98.2 F (36.8 C) 98.6 F (37 C) 98.1 F (36.7 C)  TempSrc:  Oral Oral Oral  SpO2: 100%  97% 98%  Weight:    63 kg  Height:    '5\' 4"'$  (1.626 m)    Intake/Output Summary (Last 24 hours) at 04/14/2022 0738 Last data filed at 04/14/2022 0035 Gross per 24 hour  Intake 250.47 ml  Output --  Net 250.47 ml   Filed Weights   04/13/22 1720 04/14/22 0216  Weight: 62.6 kg 63 kg    Examination:  General exam: Appears anxious  Respiratory system: diminished breath sounds b/l otherwise clear Cardiovascular system: S1 & S2+. No rubs, gallops or clicks.  Gastrointestinal system: Abdomen is nondistended, soft and  nontender. Normal bowel sounds heard. Central nervous system: Alert and oriented. Moves all extremities  Psychiatry: Judgement and insight appear normal. Anxious mood and affect.     Data Reviewed: I have personally reviewed following labs and imaging studies  CBC: Recent Labs  Lab 04/13/22 1731 04/14/22 0547  WBC 8.5 7.2  HGB 13.6 12.4  HCT 40.9 37.2  MCV 85.4 85.5  PLT 326 607   Basic Metabolic Panel: Recent Labs  Lab 04/13/22 1731 04/14/22 0547  NA 138 138  K 4.2 4.1  CL 101 104  CO2 29 26  GLUCOSE 101* 166*  BUN 11 8  CREATININE 0.76 0.73  CALCIUM 9.3 9.1   GFR: Estimated Creatinine Clearance: 78.3 mL/min (by C-G formula based on SCr of 0.73 mg/dL). Liver Function Tests: No results for input(s): "AST", "ALT", "ALKPHOS", "BILITOT", "PROT", "ALBUMIN" in the last 168 hours. No results for input(s): "LIPASE", "AMYLASE" in the last 168 hours. No results for input(s): "AMMONIA" in the last 168 hours. Coagulation Profile: Recent Labs  Lab 04/14/22 0547  INR 1.2   Cardiac Enzymes: No results for input(s): "CKTOTAL", "CKMB", "CKMBINDEX", "TROPONINI" in the last 168 hours. BNP (last 3 results) No results for input(s): "PROBNP" in the last 8760 hours. HbA1C: No results for input(s): "HGBA1C" in the last 72 hours. CBG: No results for input(s): "GLUCAP" in the last 168 hours. Lipid Profile: No results for input(s): "CHOL", "HDL", "LDLCALC", "TRIG", "CHOLHDL", "LDLDIRECT" in the last 72 hours. Thyroid Function Tests: No results  for input(s): "TSH", "T4TOTAL", "FREET4", "T3FREE", "THYROIDAB" in the last 72 hours. Anemia Panel: No results for input(s): "VITAMINB12", "FOLATE", "FERRITIN", "TIBC", "IRON", "RETICCTPCT" in the last 72 hours. Sepsis Labs: Recent Labs  Lab 04/13/22 1723  LATICACIDVEN 1.5    Recent Results (from the past 240 hour(s))  Resp Panel by RT-PCR (Flu A&B, Covid) Anterior Nasal Swab     Status: None   Collection Time: 04/13/22  8:16 PM    Specimen: Anterior Nasal Swab  Result Value Ref Range Status   SARS Coronavirus 2 by RT PCR NEGATIVE NEGATIVE Final    Comment: (NOTE) SARS-CoV-2 target nucleic acids are NOT DETECTED.  The SARS-CoV-2 RNA is generally detectable in upper respiratory specimens during the acute phase of infection. The lowest concentration of SARS-CoV-2 viral copies this assay can detect is 138 copies/mL. A negative result does not preclude SARS-Cov-2 infection and should not be used as the sole basis for treatment or other patient management decisions. A negative result may occur with  improper specimen collection/handling, submission of specimen other than nasopharyngeal swab, presence of viral mutation(s) within the areas targeted by this assay, and inadequate number of viral copies(<138 copies/mL). A negative result must be combined with clinical observations, patient history, and epidemiological information. The expected result is Negative.  Fact Sheet for Patients:  EntrepreneurPulse.com.au  Fact Sheet for Healthcare Providers:  IncredibleEmployment.be  This test is no t yet approved or cleared by the [place] FDA and  has been authorized for detection and/or diagnosis of SARS-CoV-2 by FDA under an Emergency Use Authorization (EUA). This EUA will remain  in effect (meaning this test can be used) for the duration of the COVID-19 declaration under Section 564(b)(1) of the Act, 21 U.S.C.section 360bbb-3(b)(1), unless the authorization is terminated  or revoked sooner.       Influenza A by PCR NEGATIVE NEGATIVE Final   Influenza B by PCR NEGATIVE NEGATIVE Final    Comment: (NOTE) The Xpert Xpress SARS-CoV-2/FLU/RSV plus assay is intended as an aid in the diagnosis of influenza from Nasopharyngeal swab specimens and should not be used as a sole basis for treatment. Nasal washings and aspirates are unacceptable for Xpert Xpress  SARS-CoV-2/FLU/RSV testing.  Fact Sheet for Patients: EntrepreneurPulse.com.au  Fact Sheet for Healthcare Providers: IncredibleEmployment.be  This test is not yet approved or cleared by the [place] FDA and has been authorized for detection and/or diagnosis of SARS-CoV-2 by FDA under an Emergency Use Authorization (EUA). This EUA will remain in effect (meaning this test can be used) for the duration of the COVID-19 declaration under Section 564(b)(1) of the Act, 21 U.S.C. section 360bbb-3(b)(1), unless the authorization is terminated or revoked.  Performed at [facility], [address], [place], [place] [address]          Radiology Studies: CT Angio Chest PE W and/or Wo Contrast  Result Date: 04/13/2022 CLINICAL DATA:  Chest pain and shortness of breath. EXAM: CT ANGIOGRAPHY CHEST WITH CONTRAST TECHNIQUE: Multidetector CT imaging of the chest was performed using the standard protocol during bolus administration of intravenous contrast. Multiplanar CT image reconstructions and MIPs were obtained to evaluate the vascular anatomy. RADIATION DOSE REDUCTION: This exam was performed according to the departmental dose-optimization program which includes automated exposure control, adjustment of the mA and/or kV according to patient size and/or use of iterative reconstruction technique. CONTRAST:  72mL OMNIPAQUE IOHEXOL 350 MG/ML SOLN COMPARISON:  CT of the chest 04/12/2011 FINDINGS: Cardiovascular: Satisfactory opacification of the pulmonary arteries to the  segmental level. No evidence of pulmonary embolism. Normal heart size. No pericardial effusion. Mediastinum/Nodes: No enlarged mediastinal, hilar, or axillary lymph nodes. Thyroid gland, trachea, and esophagus demonstrate no significant findings. Lungs/Pleura: There is some ill-defined patchy airspace and nodular densities clustered in the right lower lobe and minimally in the right upper  lobe measuring up to 9 mm. There is no pleural effusion or pneumothorax. Lungs are otherwise clear. Upper Abdomen: Punctate nonobstructing renal calculi. Musculoskeletal: Right mastectomy changes are present. No acute fracture or focal osseous lesion. Review of the MIP images confirms the above findings. IMPRESSION: 1. Patchy ill-defined nodular and airspace opacities in the right lower lobe and minimally in the right upper lobe, favored as infectious/inflammatory. Follow-up chest CT recommended in 3 months to re-evaluate. 2. Nonobstructing renal calculi. Electronically Signed   By: Ronney Asters M.D.   On: 04/13/2022 21:27   DG Chest 2 View  Result Date: 04/13/2022 CLINICAL DATA:  Shortness of breath for 4 weeks, treated with 4 different antibiotics without relief, history of RIGHT breast cancer and mastectomy EXAM: CHEST - 2 VIEW COMPARISON:  04/06/2022 FINDINGS: Normal heart size, mediastinal contours, and pulmonary vascularity. Lungs clear. No pulmonary infiltrate, pleural effusion, or pneumothorax. Prior RIGHT mastectomy. No acute osseous findings. IMPRESSION: No acute abnormalities. Electronically Signed   By: Lavonia Dana M.D.   On: 04/13/2022 17:45        Scheduled Meds:  enoxaparin (LOVENOX) injection  40 mg Subcutaneous Q24H   guaiFENesin  600 mg Oral BID   ipratropium-albuterol  3 mL Nebulization QID   Continuous Infusions:  sodium chloride 10 mL/hr at 04/14/22 0342   azithromycin     cefTRIAXone (ROCEPHIN)  IV 2 g (04/14/22 0404)     LOS: 0 days    Time spent: 35 mins    Wyvonnia Dusky, MD [facility] Pager [phone]  If 7PM-7AM, please contact night-coverage [email or web address] 04/14/2022, 7:38 AM

## 2022-04-14 NOTE — Progress Notes (Signed)
Pt was very uncomfortable most of the morning in her chest. A second dose of morphine and warm packs to her chest along with Xanax, an incentive spirometer, flutter valve, and hot tea finally provided pain relief.  Pt is agreeable to taking something to help her sleep tonight after I educated the pt on the critical role of sleep for immune system action and healing.   Pt only had a 36mn nap this afternoon all day. Hopefully she will sleep well tonight.

## 2022-04-14 NOTE — Assessment & Plan Note (Addendum)
-   This is sepsis as manifested by tachycardia and tachypnea in the setting of community-acquired pneumonia failing outpatient antibiotic therapy.  I suspect bacterial etiology. - She will be admitted to a medical telemetry bed. - We will continue antibiotic therapy with IV Rocephin and Zithromax. - Mucolytic therapy and bronchodilator therapy will be provided. - We will follow blood and sputum cultures.

## 2022-04-14 NOTE — Progress Notes (Signed)
Pt very weak today, saying she feels worse than yesterday. Not coughing much but able to take deep breaths. Difficulty in walking due to weakness and "not feeling right". Notified Dr. Jimmye Norman.

## 2022-04-15 DIAGNOSIS — Z853 Personal history of malignant neoplasm of breast: Secondary | ICD-10-CM | POA: Diagnosis not present

## 2022-04-15 DIAGNOSIS — F419 Anxiety disorder, unspecified: Secondary | ICD-10-CM

## 2022-04-15 DIAGNOSIS — J189 Pneumonia, unspecified organism: Secondary | ICD-10-CM | POA: Diagnosis not present

## 2022-04-15 LAB — CBC
HCT: 33.5 % — ABNORMAL LOW (ref 36.0–46.0)
Hemoglobin: 11.1 g/dL — ABNORMAL LOW (ref 12.0–15.0)
MCH: 28.6 pg (ref 26.0–34.0)
MCHC: 33.1 g/dL (ref 30.0–36.0)
MCV: 86.3 fL (ref 80.0–100.0)
Platelets: 236 10*3/uL (ref 150–400)
RBC: 3.88 MIL/uL (ref 3.87–5.11)
RDW: 14.4 % (ref 11.5–15.5)
WBC: 7.4 10*3/uL (ref 4.0–10.5)
nRBC: 0 % (ref 0.0–0.2)

## 2022-04-15 LAB — BASIC METABOLIC PANEL
Anion gap: 7 (ref 5–15)
BUN: 13 mg/dL (ref 6–20)
CO2: 28 mmol/L (ref 22–32)
Calcium: 8.8 mg/dL — ABNORMAL LOW (ref 8.9–10.3)
Chloride: 107 mmol/L (ref 98–111)
Creatinine, Ser: 0.72 mg/dL (ref 0.44–1.00)
GFR, Estimated: >60 mL/min (ref >60)
Glucose, Bld: 100 mg/dL — ABNORMAL HIGH (ref 70–99)
Potassium: 3.9 mmol/L (ref 3.5–5.1)
Sodium: 142 mmol/L (ref 135–145)

## 2022-04-15 LAB — HIV ANTIBODY (ROUTINE TESTING W REFLEX): HIV Screen 4th Generation wRfx: NONREACTIVE

## 2022-04-15 NOTE — Plan of Care (Signed)

## 2022-04-15 NOTE — Progress Notes (Signed)
PROGRESS NOTE    Melissa Weiss  HUT:[id number] DOB: 1980/10/31 DOA: 04/13/2022 PCP: Owens Loffler, MD   Assessment & Plan:   Principal Problem:   Sepsis due to pneumonia [facility]) Active Problems:   History of breast cancer in female  Assessment and Plan: Sepsis: met criteria w/ tachycardia, tachypnea & pneumonia. Continue on bronchodilators & abxs. Sepsis resolved  CAP: continue on IV rocephin, azithromycin, bronchodilator & encourage incentive spirometry. Pt declined cough medication. Failed outpatient doxycycline. Strep is neg. Legionella, mycoplasma are pending. Viral respiratory panel is neg. Sputum cx is pending   Hx of breast cancer: s/p lumpectomy then mastectomy. Management per onco outpatient    Anxiety: severity unknown. Xanax prn      DVT prophylaxis: lovenox Code Status: full  Family Communication: discussed pt's care w/ pt's family at bedside and answered their questions Disposition Plan: likely d/c back home   Level of care: Telemetry Medical  Status is: Inpatient Remains inpatient appropriate because: severity of illness    Consultants:    Procedures:   Antimicrobials: rocephin, azithromycin    Subjective: Pt c/o congestion   Objective: Vitals:   04/14/22 2025 04/14/22 2030 04/14/22 2206 04/15/22 0459  BP: (!) 127/92  111/77 110/75  Pulse: (!) 120  (!) 108 86  Resp: _0 Temp: 98.6 F (37 C)  97.8 F (36.6 C) 98.1 F (36.7 C)  TempSrc: Oral  Oral Oral  SpO2: 99% 97% 98% 99%  Weight:      Height:        Intake/Output Summary (Last 24 hours) at 04/15/2022 0744 Last data filed at 04/15/2022 0322 Gross per 24 hour  Intake 358.86 ml  Output --  Net 358.86 ml   Filed Weights   04/13/22 1720 04/14/22 0216  Weight: 62.6 kg 63 kg    Examination:  General exam: Appears anxious  Respiratory system: diminished breath sounds b/l otherwise clear Cardiovascular system: S1 & S2+. No rubs, gallops or clicks.  Gastrointestinal system:  Abdomen is nondistended, soft and nontender. Normal bowel sounds heard. Central nervous system: Alert and oriented. Moves all extremities  Psychiatry: Judgement and insight appear normal. Anxious mood and affect.     Data Reviewed: I have personally reviewed following labs and imaging studies  CBC: Recent Labs  Lab 04/13/22 1731 04/14/22 0547  WBC 8.5 7.2  HGB 13.6 12.4  HCT 40.9 37.2  MCV 85.4 85.5  PLT 326 656   Basic Metabolic Panel: Recent Labs  Lab 04/13/22 1731 04/14/22 0547  NA 138 138  K 4.2 4.1  CL 101 104  CO2 29 26  GLUCOSE 101* 166*  BUN 11 8  CREATININE 0.76 0.73  CALCIUM 9.3 9.1   GFR: Estimated Creatinine Clearance: 78.3 mL/min (by C-G formula based on SCr of 0.73 mg/dL). Liver Function Tests: No results for input(s): "AST", "ALT", "ALKPHOS", "BILITOT", "PROT", "ALBUMIN" in the last 168 hours. No results for input(s): "LIPASE", "AMYLASE" in the last 168 hours. No results for input(s): "AMMONIA" in the last 168 hours. Coagulation Profile: Recent Labs  Lab 04/14/22 0547  INR 1.2   Cardiac Enzymes: No results for input(s): "CKTOTAL", "CKMB", "CKMBINDEX", "TROPONINI" in the last 168 hours. BNP (last 3 results) No results for input(s): "PROBNP" in the last 8760 hours. HbA1C: No results for input(s): "HGBA1C" in the last 72 hours. CBG: No results for input(s): "GLUCAP" in the last 168 hours. Lipid Profile: No results for input(s): "CHOL", "HDL", "LDLCALC", "TRIG", "CHOLHDL", "LDLDIRECT" in the last 72  hours. Thyroid Function Tests: No results for input(s): "TSH", "T4TOTAL", "FREET4", "T3FREE", "THYROIDAB" in the last 72 hours. Anemia Panel: No results for input(s): "VITAMINB12", "FOLATE", "FERRITIN", "TIBC", "IRON", "RETICCTPCT" in the last 72 hours. Sepsis Labs: Recent Labs  Lab 04/13/22 1723 04/14/22 0547  PROCALCITON  --  <0.10  LATICACIDVEN 1.5  --     Recent Results (from the past 240 hour(s))  Resp Panel by RT-PCR (Flu A&B, Covid)  Anterior Nasal Swab     Status: None   Collection Time: 04/13/22  8:16 PM   Specimen: Anterior Nasal Swab  Result Value Ref Range Status   SARS Coronavirus 2 by RT PCR NEGATIVE NEGATIVE Final    Comment: (NOTE) SARS-CoV-2 target nucleic acids are NOT DETECTED.  The SARS-CoV-2 RNA is generally detectable in upper respiratory specimens during the acute phase of infection. The lowest concentration of SARS-CoV-2 viral copies this assay can detect is 138 copies/mL. A negative result does not preclude SARS-Cov-2 infection and should not be used as the sole basis for treatment or other patient management decisions. A negative result may occur with  improper specimen collection/handling, submission of specimen other than nasopharyngeal swab, presence of viral mutation(s) within the areas targeted by this assay, and inadequate number of viral copies(<138 copies/mL). A negative result must be combined with clinical observations, patient history, and epidemiological information. The expected result is Negative.  Fact Sheet for Patients:  EntrepreneurPulse.com.au  Fact Sheet for Healthcare Providers:  IncredibleEmployment.be  This test is no t yet approved or cleared by the [place] FDA and  has been authorized for detection and/or diagnosis of SARS-CoV-2 by FDA under an Emergency Use Authorization (EUA). This EUA will remain  in effect (meaning this test can be used) for the duration of the COVID-19 declaration under Section 564(b)(1) of the Act, 21 U.S.C.section 360bbb-3(b)(1), unless the authorization is terminated  or revoked sooner.       Influenza A by PCR NEGATIVE NEGATIVE Final   Influenza B by PCR NEGATIVE NEGATIVE Final    Comment: (NOTE) The Xpert Xpress SARS-CoV-2/FLU/RSV plus assay is intended as an aid in the diagnosis of influenza from Nasopharyngeal swab specimens and should not be used as a sole basis for treatment. Nasal washings  and aspirates are unacceptable for Xpert Xpress SARS-CoV-2/FLU/RSV testing.  Fact Sheet for Patients: EntrepreneurPulse.com.au  Fact Sheet for Healthcare Providers: IncredibleEmployment.be  This test is not yet approved or cleared by the [place] FDA and has been authorized for detection and/or diagnosis of SARS-CoV-2 by FDA under an Emergency Use Authorization (EUA). This EUA will remain in effect (meaning this test can be used) for the duration of the COVID-19 declaration under Section 564(b)(1) of the Act, 21 U.S.C. section 360bbb-3(b)(1), unless the authorization is terminated or revoked.  Performed at [facility], [address] [place], [place] [address]   Culture, blood (Routine X 2) w Reflex to ID Panel     Status: None (Preliminary result)   Collection Time: 04/14/22  8:58 AM   Specimen: BLOOD LEFT ARM  Result Value Ref Range Status   Specimen Description BLOOD LEFT ARM  Final   Special Requests   Final    BOTTLES DRAWN AEROBIC AND ANAEROBIC Blood Culture adequate volume   Culture   Final    NO GROWTH < 24 HOURS Performed at [facility], [address] [place], [place] [address]    Report Status PENDING  Incomplete  Culture, blood (Routine X 2) w Reflex to ID  Panel     Status: None (Preliminary result)   Collection Time: 04/14/22  9:05 AM   Specimen: BLOOD LEFT HAND  Result Value Ref Range Status   Specimen Description BLOOD LEFT HAND  Final   Special Requests   Final    BOTTLES DRAWN AEROBIC AND ANAEROBIC Blood Culture results may not be optimal due to an inadequate volume of blood received in culture bottles   Culture   Final    NO GROWTH < 24 HOURS Performed at [facility], [address] [place], [place] [address]    Report Status PENDING  Incomplete  Respiratory (~20 pathogens) panel by PCR     Status: None   Collection Time: 04/14/22 10:12 AM   Specimen: Nasopharyngeal Swab;  Respiratory  Result Value Ref Range Status   Adenovirus NOT DETECTED NOT DETECTED Final   Coronavirus 229E NOT DETECTED NOT DETECTED Final    Comment: (NOTE) The Coronavirus on the Respiratory Panel, DOES NOT test for the novel  Coronavirus (2019 nCoV)    Coronavirus HKU1 NOT DETECTED NOT DETECTED Final   Coronavirus NL63 NOT DETECTED NOT DETECTED Final   Coronavirus OC43 NOT DETECTED NOT DETECTED Final   Metapneumovirus NOT DETECTED NOT DETECTED Final   Rhinovirus / Enterovirus NOT DETECTED NOT DETECTED Final   Influenza A NOT DETECTED NOT DETECTED Final   Influenza B NOT DETECTED NOT DETECTED Final   Parainfluenza Virus 1 NOT DETECTED NOT DETECTED Final   Parainfluenza Virus 2 NOT DETECTED NOT DETECTED Final   Parainfluenza Virus 3 NOT DETECTED NOT DETECTED Final   Parainfluenza Virus 4 NOT DETECTED NOT DETECTED Final   Respiratory Syncytial Virus NOT DETECTED NOT DETECTED Final   Bordetella pertussis NOT DETECTED NOT DETECTED Final   Bordetella Parapertussis NOT DETECTED NOT DETECTED Final   Chlamydophila pneumoniae NOT DETECTED NOT DETECTED Final   Mycoplasma pneumoniae NOT DETECTED NOT DETECTED Final    Comment: Performed at [facility], [place]. [address] [place], [place] [address]  Expectorated Sputum Assessment w Gram Stain, Rflx to Resp Cult     Status: None   Collection Time: 04/14/22  2:00 PM   Specimen: Nasopharyngeal Swab; Sputum  Result Value Ref Range Status   Specimen Description EXPECTORATED SPUTUM  Final   Special Requests NONE  Final   Sputum evaluation   Final    THIS SPECIMEN IS ACCEPTABLE FOR SPUTUM CULTURE Performed at [facility], [address] [place], [place] [address]    Report Status 04/14/2022 FINAL  Final  Culture, Respiratory w Gram Stain     Status: None (Preliminary result)   Collection Time: 04/14/22  2:00 PM  Result Value Ref Range Status   Specimen Description   Final    EXPECTORATED SPUTUM Performed at [facility], [address] [place], [place] [address]    Special Requests   Final    NONE Reflexed from [id number] Performed at [facility], [place], [place] [address]    Gram Stain   Final    NO WBC SEEN MODERATE GRAM NEGATIVE RODS RARE GRAM POSITIVE COCCI IN PAIRS RARE SQUAMOUS EPITHELIAL CELLS PRESENT Performed at [facility], [place] [address] [place], [place] [address]    Culture PENDING  Incomplete   Report Status PENDING  Incomplete         Radiology Studies: CT Angio Chest PE W and/or Wo Contrast  Result Date: 04/13/2022 CLINICAL DATA:  Chest pain and shortness of breath.  EXAM: CT ANGIOGRAPHY CHEST WITH CONTRAST TECHNIQUE: Multidetector CT imaging of the chest was performed using the standard protocol during bolus administration of intravenous contrast. Multiplanar CT image reconstructions and MIPs were obtained to evaluate the vascular anatomy. RADIATION DOSE REDUCTION: This exam was performed according to the departmental dose-optimization program which includes automated exposure control, adjustment of the mA and/or kV according to patient size and/or use of iterative reconstruction technique. CONTRAST:  76m OMNIPAQUE IOHEXOL 350 MG/ML SOLN COMPARISON:  CT of the chest 04/12/2011 FINDINGS: Cardiovascular: Satisfactory opacification of the pulmonary arteries to the segmental level. No evidence of pulmonary embolism. Normal heart size. No pericardial effusion. Mediastinum/Nodes: No enlarged mediastinal, hilar, or axillary lymph nodes. Thyroid gland, trachea, and esophagus demonstrate no significant findings. Lungs/Pleura: There is some ill-defined patchy airspace and nodular densities clustered in the right lower lobe and minimally in the right upper lobe measuring up to 9 mm. There is no pleural effusion or pneumothorax. Lungs are otherwise clear. Upper Abdomen: Punctate nonobstructing renal calculi. Musculoskeletal: Right mastectomy changes are present. No  acute fracture or focal osseous lesion. Review of the MIP images confirms the above findings. IMPRESSION: 1. Patchy ill-defined nodular and airspace opacities in the right lower lobe and minimally in the right upper lobe, favored as infectious/inflammatory. Follow-up chest CT recommended in 3 months to re-evaluate. 2. Nonobstructing renal calculi. Electronically Signed   By: ARonney AstersM.D.   On: 04/13/2022 21:27   DG Chest 2 View  Result Date: 04/13/2022 CLINICAL DATA:  Shortness of breath for 4 weeks, treated with 4 different antibiotics without relief, history of RIGHT breast cancer and mastectomy EXAM: CHEST - 2 VIEW COMPARISON:  04/06/2022 FINDINGS: Normal heart size, mediastinal contours, and pulmonary vascularity. Lungs clear. No pulmonary infiltrate, pleural effusion, or pneumothorax. Prior RIGHT mastectomy. No acute osseous findings. IMPRESSION: No acute abnormalities. Electronically Signed   By: MLavonia DanaM.D.   On: 04/13/2022 17:45        Scheduled Meds:  enoxaparin (LOVENOX) injection  40 mg Subcutaneous Q24H   guaiFENesin  600 mg Oral BID   hyoscyamine  0.25 mg Sublingual Once   ipratropium-albuterol  3 mL Nebulization QID   pantoprazole  40 mg Oral Daily   Continuous Infusions:  sodium chloride Stopped (04/14/22 0508)   azithromycin Stopped (04/14/22 2253)   cefTRIAXone (ROCEPHIN)  IV 2 g (04/15/22 0546)     LOS: 1 day    Time spent: 30 mins    JWyvonnia Dusky MD [facility] Pager [phone]  If 7PM-7AM, please contact night-coverage [email or web address] 04/15/2022, 7:44 AM

## 2022-04-16 DIAGNOSIS — F419 Anxiety disorder, unspecified: Secondary | ICD-10-CM | POA: Diagnosis not present

## 2022-04-16 DIAGNOSIS — Z853 Personal history of malignant neoplasm of breast: Secondary | ICD-10-CM | POA: Diagnosis not present

## 2022-04-16 DIAGNOSIS — A419 Sepsis, unspecified organism: Secondary | ICD-10-CM | POA: Diagnosis not present

## 2022-04-16 DIAGNOSIS — J189 Pneumonia, unspecified organism: Secondary | ICD-10-CM | POA: Diagnosis not present

## 2022-04-16 LAB — BASIC METABOLIC PANEL
Anion gap: 7 (ref 5–15)
BUN: 15 mg/dL (ref 6–20)
CO2: 26 mmol/L (ref 22–32)
Calcium: 8.6 mg/dL — ABNORMAL LOW (ref 8.9–10.3)
Chloride: 108 mmol/L (ref 98–111)
Creatinine, Ser: 0.76 mg/dL (ref 0.44–1.00)
GFR, Estimated: >60 mL/min (ref >60)
Glucose, Bld: 95 mg/dL (ref 70–99)
Potassium: 3.9 mmol/L (ref 3.5–5.1)
Sodium: 141 mmol/L (ref 135–145)

## 2022-04-16 LAB — CBC
HCT: 33.1 % — ABNORMAL LOW (ref 36.0–46.0)
Hemoglobin: 10.9 g/dL — ABNORMAL LOW (ref 12.0–15.0)
MCH: 28.8 pg (ref 26.0–34.0)
MCHC: 32.9 g/dL (ref 30.0–36.0)
MCV: 87.6 fL (ref 80.0–100.0)
Platelets: 221 10*3/uL (ref 150–400)
RBC: 3.78 MIL/uL — ABNORMAL LOW (ref 3.87–5.11)
RDW: 14.6 % (ref 11.5–15.5)
WBC: 6.3 10*3/uL (ref 4.0–10.5)
nRBC: 0 % (ref 0.0–0.2)

## 2022-04-16 LAB — LEGIONELLA PNEUMOPHILA SEROGP 1 UR AG: L. pneumophila Serogp 1 Ur Ag: NEGATIVE

## 2022-04-16 LAB — CULTURE, RESPIRATORY W GRAM STAIN
Culture: NORMAL
Gram Stain: NONE SEEN

## 2022-04-16 MED ORDER — AZITHROMYCIN 500 MG PO TABS: 500.0000 mg | ORAL_TABLET | Freq: Every day | ORAL | 0 refills | Status: AC

## 2022-04-16 MED ORDER — IPRATROPIUM-ALBUTEROL 20-100 MCG/ACT IN AERS: 1.0000 | INHALATION_SPRAY | Freq: Four times a day (QID) | RESPIRATORY_TRACT | 0 refills | Status: DC | PRN

## 2022-04-16 MED ORDER — ALPRAZOLAM 0.25 MG PO TABS: 0.2500 mg | ORAL_TABLET | Freq: Two times a day (BID) | ORAL | 0 refills | Status: DC | PRN

## 2022-04-16 MED ORDER — SODIUM CHLORIDE 0.9 % IV SOLN
500.0000 mg | INTRAVENOUS | Status: DC
  Administered 2022-04-16: 500 mg via INTRAVENOUS
  Filled 2022-04-16: qty 5

## 2022-04-16 NOTE — Discharge Summary (Addendum)
Physician Discharge Summary  Melissa Weiss HYI:[id number] DOB: 15-Jun-1981 DOA: 04/13/2022  PCP: Melissa Loffler, MD  Admit date: 04/13/2022 Discharge date: 04/16/2022  Admitted From: home  Disposition:  home   Recommendations for Outpatient Follow-up:  Follow up with PCP in 1-2 weeks Will need a f/u CXR in 6 weeks to confirm resolution of the pneumonia   Home Health: no  Equipment/Devices:  Discharge Condition: stable  CODE STATUS: full  Diet recommendation: Heart Health  Brief/Interim Summary: HPI was taken from Dr. Sidney Weiss: Melissa Weiss is a 41 y.o. female with medical history significant for breast cancer status post right breast lumpectomy followed by recurrence in 7 years and right mastectomy, who presented to the emergency room with acute onset of worsening dyspnea with associated cough productive of thick yellow sputum as well as wheezing which have been going on over the last 4 weeks.  Though she was seen on an outpatient basis and prescribed the course of p.o. doxycycline followed by Augmentin as well as p.o. prednisone and for the last 3 days she was given 500 mg of p.o. Levaquin.  She had a Tmax at home of 99.8 and today 100.4.  She denied any chest pain or palpitations.  She has been having persistent symptoms and significant fatigue and tiredness.  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  She then presented to the ER.  She denies any dysuria, oliguria or hematuria or flank pain.  No nausea or vomiting or abdominal pain.   ED Course: Upon presenting to the emergency room, BP was 136/102 and later 122/89 with heart rate of 98 and later 108 with respiratory rate that was normal later 27.  Labs revealed unremarkable BMP.  High sensitive troponin I was less than 2 and lactic acid 1.5 with normal CBC.  Influenza antigens and COVID-19 PCR came back negative.   Imaging: Two-view chest x-ray showed no acute cardiopulmonary  disease.  Chest CTA revealed patchy ill-defined nodular airspace opacities in the right lower lobe and minimally in the right upper lobe favoring infectious/inflammatory etiology.   EKG as reviewed by me : EKG showed normal sinus rhythm with a rate of 95.   The patient was given IV Zithromax and added IV Rocephin, Decadron and 2 DuoNebs as well as 4 mg of IV Zofran and 1 L bolus of IV normal saline.  She will be admitted to a medical telemetry bed for further evaluation and management.  Hospital course as per Dr. Jimmye Weiss 9/9-9/11/23: Pt presented w/ sepsis secondary to pneumonia. Pt was treated w/ IV rocephin, azithromycin, bronchodilators, incentive spirometry. Pt did not want any cough medicine. Pt did fail outpatient treatment w/ doxycycline, augmentin & prednisone. Sepsis has resolved prior to d/c. Pt did not require supplemental oxygen while inpatient. COVID19, viral respiratory panel, strep were all neg. Legionella, mycoplasma were still pending. No fevers, normal WBCs, vital signs are stable & labs are stable. Pt was able to ambulate and transfer independently so therapy was not indicated.    Discharge Diagnoses:  Principal Problem:   Sepsis due to pneumonia [facility]) Active Problems:   History of breast cancer in female  Sepsis: met criteria w/ tachycardia, tachypnea & pneumonia. Continue on bronchodilators & abxs. Sepsis resolved   CAP: continue on IV rocephin, azithromycin while inpatient, bronchodilator & encourage incentive spirometry. Pt declined cough medication. Failed outpatient doxycycline, augmentin & prednisone. Will d/c home w/ po azithromycin to complete  the course as well as an inhaler to use prn for wheezing and/or shortness of breath. Strep is neg. Legionella, mycoplasma are pending. Viral respiratory panel is neg. Sputum cx is pending    Hx of breast cancer: s/p lumpectomy then mastectomy. Management per onco outpatient     Anxiety: severity unknown. Xanax prn   Discharge  Instructions  Discharge Instructions     Diet general   Complete by: As directed    Discharge instructions   Complete by: As directed    F/u w/ PCP in 1-2 weeks. Will need repeat chest x-ray in 6 weeks to confirm clearance of the pneumonia.   Increase activity slowly   Complete by: As directed       Allergies as of 04/16/2022       Reactions   Codeine Nausea Only   Erythromycin Nausea Only   Ferrous Sulfate Nausea Only   Lidocaine Other (See Comments)   Swelling in mouth    Vicodin [hydrocodone-acetaminophen] Nausea And Vomiting   Epinephrine Palpitations        Medication List     STOP taking these medications    amoxicillin-clavulanate 875-125 MG tablet Commonly known as: AUGMENTIN   levofloxacin 500 MG tablet Commonly known as: LEVAQUIN   predniSONE 20 MG tablet Commonly known as: DELTASONE       TAKE these medications    ALPRAZolam 0.25 MG tablet Commonly known as: XANAX Take 1 tablet (0.25 mg total) by mouth 2 (two) times daily as needed for up to 7 days for anxiety.   azithromycin 500 MG tablet Commonly known as: Zithromax Take 1 tablet (500 mg total) by mouth daily for 2 days.   Ipratropium-Albuterol 20-100 MCG/ACT Aers respimat Commonly known as: COMBIVENT Inhale 1 puff into the lungs every 6 (six) hours as needed for wheezing or shortness of breath.        Allergies  Allergen Reactions   Codeine Nausea Only   Erythromycin Nausea Only   Ferrous Sulfate Nausea Only   Lidocaine Other (See Comments)    Swelling in mouth    Vicodin [Hydrocodone-Acetaminophen] Nausea And Vomiting   Epinephrine Palpitations    Consultations:    Procedures/Studies: CT Angio Chest PE W and/or Wo Contrast  Result Date: 04/13/2022 CLINICAL DATA:  Chest pain and shortness of breath. EXAM: CT ANGIOGRAPHY CHEST WITH CONTRAST TECHNIQUE: Multidetector CT imaging of the chest was performed using the standard protocol during bolus administration of intravenous  contrast. Multiplanar CT image reconstructions and MIPs were obtained to evaluate the vascular anatomy. RADIATION DOSE REDUCTION: This exam was performed according to the departmental dose-optimization program which includes automated exposure control, adjustment of the mA and/or kV according to patient size and/or use of iterative reconstruction technique. CONTRAST:  69mL OMNIPAQUE IOHEXOL 350 MG/ML SOLN COMPARISON:  CT of the chest 04/12/2011 FINDINGS: Cardiovascular: Satisfactory opacification of the pulmonary arteries to the segmental level. No evidence of pulmonary embolism. Normal heart size. No pericardial effusion. Mediastinum/Nodes: No enlarged mediastinal, hilar, or axillary lymph nodes. Thyroid gland, trachea, and esophagus demonstrate no significant findings. Lungs/Pleura: There is some ill-defined patchy airspace and nodular densities clustered in the right lower lobe and minimally in the right upper lobe measuring up to 9 mm. There is no pleural effusion or pneumothorax. Lungs are otherwise clear. Upper Abdomen: Punctate nonobstructing renal calculi. Musculoskeletal: Right mastectomy changes are present. No acute fracture or focal osseous lesion. Review of the MIP images confirms the above findings. IMPRESSION: 1. Patchy ill-defined nodular and airspace  opacities in the right lower lobe and minimally in the right upper lobe, favored as infectious/inflammatory. Follow-up chest CT recommended in 3 months to re-evaluate. 2. Nonobstructing renal calculi. Electronically Signed   By: Ronney Asters M.D.   On: 04/13/2022 21:27   DG Chest 2 View  Result Date: 04/13/2022 CLINICAL DATA:  Shortness of breath for 4 weeks, treated with 4 different antibiotics without relief, history of RIGHT breast cancer and mastectomy EXAM: CHEST - 2 VIEW COMPARISON:  04/06/2022 FINDINGS: Normal heart size, mediastinal contours, and pulmonary vascularity. Lungs clear. No pulmonary infiltrate, pleural effusion, or pneumothorax.  Prior RIGHT mastectomy. No acute osseous findings. IMPRESSION: No acute abnormalities. Electronically Signed   By: Lavonia Dana M.D.   On: 04/13/2022 17:45   DG Chest 2 View  Result Date: 04/08/2022 CLINICAL DATA:  Wheezing and shortness of breath. History of breast carcinoma. EXAM: CHEST - 2 VIEW COMPARISON:  04/04/2020. FINDINGS: Normal heart, mediastinum and hila. Prominent bronchovascular markings, stable.  Lungs otherwise clear. No pleural effusion or pneumothorax. Changes from a prior right mastectomy. Skeletal structures are intact. IMPRESSION: No active cardiopulmonary disease. Electronically Signed   By: Lajean Manes M.D.   On: 04/08/2022 09:08   (Echo, Carotid, EGD, Colonoscopy, ERCP)    Subjective: Pt c/o low energy and cough    Discharge Exam: Vitals:   04/16/22 0520 04/16/22 0820  BP: 100/72 112/76  Pulse: 89 81  Resp: 16 12  Temp: 97.8 F (36.6 C) 98.5 F (36.9 C)  SpO2: 98% 99%   Vitals:   04/15/22 1916 04/15/22 1916 04/16/22 0520 04/16/22 0820  BP: 107/77  100/72 112/76  Pulse: (!) 111 (!) 101 89 81  Resp: $Remo'16  16 12  'PNBwg$ Temp: 98 F (36.7 C)  97.8 F (36.6 C) 98.5 F (36.9 C)  TempSrc: Oral  Oral Oral  SpO2: 100% 100% 98% 99%  Weight:      Height:        General: Pt is alert, awake, not in acute distress Cardiovascular: S1/S2 +, no rubs, no gallops Respiratory: clear breath sounds b/l. No wheezes, rale  Abdominal: Soft, NT, ND, bowel sounds + Extremities: no edema, no cyanosis    The results of significant diagnostics from this hospitalization (including imaging, microbiology, ancillary and laboratory) are listed below for reference.     Microbiology: Recent Results (from the past 240 hour(s))  Resp Panel by RT-PCR (Flu A&B, Covid) Anterior Nasal Swab     Status: None   Collection Time: 04/13/22  8:16 PM   Specimen: Anterior Nasal Swab  Result Value Ref Range Status   SARS Coronavirus 2 by RT PCR NEGATIVE NEGATIVE Final    Comment: (NOTE) SARS-CoV-2  target nucleic acids are NOT DETECTED.  The SARS-CoV-2 RNA is generally detectable in upper respiratory specimens during the acute phase of infection. The lowest concentration of SARS-CoV-2 viral copies this assay can detect is 138 copies/mL. A negative result does not preclude SARS-Cov-2 infection and should not be used as the sole basis for treatment or other patient management decisions. A negative result may occur with  improper specimen collection/handling, submission of specimen other than nasopharyngeal swab, presence of viral mutation(s) within the areas targeted by this assay, and inadequate number of viral copies(<138 copies/mL). A negative result must be combined with clinical observations, patient history, and epidemiological information. The expected result is Negative.  Fact Sheet for Patients:  EntrepreneurPulse.com.au  Fact Sheet for Healthcare Providers:  IncredibleEmployment.be  This test is no t yet approved  or cleared by the [place] and  has been authorized for detection and/or diagnosis of SARS-CoV-2 by FDA under an Emergency Use Authorization (EUA). This EUA will remain  in effect (meaning this test can be used) for the duration of the COVID-19 declaration under Section 564(b)(1) of the Act, 21 U.S.C.section 360bbb-3(b)(1), unless the authorization is terminated  or revoked sooner.       Influenza A by PCR NEGATIVE NEGATIVE Final   Influenza B by PCR NEGATIVE NEGATIVE Final    Comment: (NOTE) The Xpert Xpress SARS-CoV-2/FLU/RSV plus assay is intended as an aid in the diagnosis of influenza from Nasopharyngeal swab specimens and should not be used as a sole basis for treatment. Nasal washings and aspirates are unacceptable for Xpert Xpress SARS-CoV-2/FLU/RSV testing.  Fact Sheet for Patients: EntrepreneurPulse.com.au  Fact Sheet for Healthcare  Providers: IncredibleEmployment.be  This test is not yet approved or cleared by the [place] FDA and has been authorized for detection and/or diagnosis of SARS-CoV-2 by FDA under an Emergency Use Authorization (EUA). This EUA will remain in effect (meaning this test can be used) for the duration of the COVID-19 declaration under Section 564(b)(1) of the Act, 21 U.S.C. section 360bbb-3(b)(1), unless the authorization is terminated or revoked.  Performed at [facility], [address] [place], [place] [address]   Culture, blood (Routine X 2) w Reflex to ID Panel     Status: None (Preliminary result)   Collection Time: 04/14/22  8:58 AM   Specimen: BLOOD LEFT ARM  Result Value Ref Range Status   Specimen Description BLOOD LEFT ARM  Final   Special Requests   Final    BOTTLES DRAWN AEROBIC AND ANAEROBIC Blood Culture adequate volume   Culture   Final    NO GROWTH 2 DAYS Performed at [facility], [address], [place], [place] [address]    Report Status PENDING  Incomplete  Culture, blood (Routine X 2) w Reflex to ID Panel     Status: None (Preliminary result)   Collection Time: 04/14/22  9:05 AM   Specimen: BLOOD LEFT HAND  Result Value Ref Range Status   Specimen Description BLOOD LEFT HAND  Final   Special Requests   Final    BOTTLES DRAWN AEROBIC AND ANAEROBIC Blood Culture results may not be optimal due to an inadequate volume of blood received in culture bottles   Culture   Final    NO GROWTH 2 DAYS Performed at [facility], [address] [place], [place] [address]    Report Status PENDING  Incomplete  Respiratory (~20 pathogens) panel by PCR     Status: None   Collection Time: 04/14/22 10:12 AM   Specimen: Nasopharyngeal Swab; Respiratory  Result Value Ref Range Status   Adenovirus NOT DETECTED NOT DETECTED Final   Coronavirus 229E NOT DETECTED NOT DETECTED Final    Comment: (NOTE) The Coronavirus on the  Respiratory Panel, DOES NOT test for the novel  Coronavirus (2019 nCoV)    Coronavirus HKU1 NOT DETECTED NOT DETECTED Final   Coronavirus NL63 NOT DETECTED NOT DETECTED Final   Coronavirus OC43 NOT DETECTED NOT DETECTED Final   Metapneumovirus NOT DETECTED NOT DETECTED Final   Rhinovirus / Enterovirus NOT DETECTED NOT DETECTED Final   Influenza A NOT DETECTED NOT DETECTED Final   Influenza B NOT DETECTED NOT DETECTED Final   Parainfluenza Virus 1 NOT DETECTED NOT DETECTED Final   Parainfluenza Virus 2 NOT DETECTED NOT DETECTED Final  Parainfluenza Virus 3 NOT DETECTED NOT DETECTED Final   Parainfluenza Virus 4 NOT DETECTED NOT DETECTED Final   Respiratory Syncytial Virus NOT DETECTED NOT DETECTED Final   Bordetella pertussis NOT DETECTED NOT DETECTED Final   Bordetella Parapertussis NOT DETECTED NOT DETECTED Final   Chlamydophila pneumoniae NOT DETECTED NOT DETECTED Final   Mycoplasma pneumoniae NOT DETECTED NOT DETECTED Final    Comment: Performed at [facility], [place] [address] [place], [place] [address]  Expectorated Sputum Assessment w Gram Stain, Rflx to Resp Cult     Status: None   Collection Time: 04/14/22  2:00 PM   Specimen: Nasopharyngeal Swab; Sputum  Result Value Ref Range Status   Specimen Description EXPECTORATED SPUTUM  Final   Special Requests NONE  Final   Sputum evaluation   Final    THIS SPECIMEN IS ACCEPTABLE FOR SPUTUM CULTURE Performed at [facility], [address] [place], [place] [address]    Report Status 04/14/2022 FINAL  Final  Culture, Respiratory w Gram Stain     Status: None (Preliminary result)   Collection Time: 04/14/22  2:00 PM  Result Value Ref Range Status   Specimen Description   Final    EXPECTORATED SPUTUM Performed at [facility], [address] [place], [place] [address]    Special Requests   Final    NONE Reflexed from [id number] Performed at [facility], [place], [place]  [address]    Gram Stain   Final    NO WBC SEEN MODERATE GRAM NEGATIVE RODS RARE GRAM POSITIVE COCCI IN PAIRS RARE SQUAMOUS EPITHELIAL CELLS PRESENT    Culture   Final    NO GROWTH < 12 HOURS Performed at [facility], [place] [address], [place], [place] [address]    Report Status PENDING  Incomplete     Labs: BNP (last 3 results) No results for input(s): "BNP" in the last 8760 hours. Basic Metabolic Panel: Recent Labs  Lab 04/13/22 1731 04/14/22 0547 04/15/22 0751 04/16/22 0805  NA 138 138 142 141  K 4.2 4.1 3.9 3.9  CL 101 104 107 108  CO2 $Re'29 26 28 26  'yPV$ GLUCOSE 101* 166* 100* 95  BUN $Re'11 8 13 15  'zfF$ CREATININE 0.76 0.73 0.72 0.76  CALCIUM 9.3 9.1 8.8* 8.6*   Liver Function Tests: No results for input(s): "AST", "ALT", "ALKPHOS", "BILITOT", "PROT", "ALBUMIN" in the last 168 hours. No results for input(s): "LIPASE", "AMYLASE" in the last 168 hours. No results for input(s): "AMMONIA" in the last 168 hours. CBC: Recent Labs  Lab 04/13/22 1731 04/14/22 0547 04/15/22 0751 04/16/22 0805  WBC 8.5 7.2 7.4 6.3  HGB 13.6 12.4 11.1* 10.9*  HCT 40.9 37.2 33.5* 33.1*  MCV 85.4 85.5 86.3 87.6  PLT 326 299 236 221   Cardiac Enzymes: No results for input(s): "CKTOTAL", "CKMB", "CKMBINDEX", "TROPONINI" in the last 168 hours. BNP: Invalid input(s): "POCBNP" CBG: No results for input(s): "GLUCAP" in the last 168 hours. D-Dimer No results for input(s): "DDIMER" in the last 72 hours. Hgb A1c No results for input(s): "HGBA1C" in the last 72 hours. Lipid Profile No results for input(s): "CHOL", "HDL", "LDLCALC", "TRIG", "CHOLHDL", "LDLDIRECT" in the last 72 hours. Thyroid function studies No results for input(s): "TSH", "T4TOTAL", "T3FREE", "THYROIDAB" in the last 72 hours.  Invalid input(s): "FREET3" Anemia work up No results for input(s): "VITAMINB12", "FOLATE", "FERRITIN", "TIBC", "IRON", "RETICCTPCT" in the last 72 hours. Urinalysis    Component Value Date/Time  COLORURINE YELLOW 11/10/2007 2241   APPEARANCEUR CLEAR 11/10/2007 2241   LABSPEC >1.030 (H) 11/10/2007 2241   PHURINE 6.0 11/10/2007 2241   GLUCOSEU NEGATIVE 11/10/2007 2241   HGBUR SMALL (A) 11/10/2007 2241   HGBUR negative 03/07/2007 1605   BILIRUBINUR NEGATIVE 11/10/2007 2241   KETONESUR 15 (A) 11/10/2007 2241   PROTEINUR trace 02/11/2012 1026   PROTEINUR NEGATIVE 11/10/2007 2241   UROBILINOGEN negative 02/11/2012 1026   UROBILINOGEN 0.2 11/10/2007 2241   NITRITE neg 02/11/2012 1026   NITRITE NEGATIVE 11/10/2007 2241   LEUKOCYTESUR moderate (2+) 02/11/2012 1026   Sepsis Labs Recent Labs  Lab 04/13/22 1731 04/14/22 0547 04/15/22 0751 04/16/22 0805  WBC 8.5 7.2 7.4 6.3   Microbiology Recent Results (from the past 240 hour(s))  Resp Panel by RT-PCR (Flu A&B, Covid) Anterior Nasal Swab     Status: None   Collection Time: 04/13/22  8:16 PM   Specimen: Anterior Nasal Swab  Result Value Ref Range Status   SARS Coronavirus 2 by RT PCR NEGATIVE NEGATIVE Final    Comment: (NOTE) SARS-CoV-2 target nucleic acids are NOT DETECTED.  The SARS-CoV-2 RNA is generally detectable in upper respiratory specimens during the acute phase of infection. The lowest concentration of SARS-CoV-2 viral copies this assay can detect is 138 copies/mL. A negative result does not preclude SARS-Cov-2 infection and should not be used as the sole basis for treatment or other patient management decisions. A negative result may occur with  improper specimen collection/handling, submission of specimen other than nasopharyngeal swab, presence of viral mutation(s) within the areas targeted by this assay, and inadequate number of viral copies(<138 copies/mL). A negative result must be combined with clinical observations, patient history, and epidemiological information. The expected result is Negative.  Fact Sheet for Patients:  BloggerCourse.com  Fact Sheet for Healthcare  Providers:  SeriousBroker.it  This test is no t yet approved or cleared by the [place] FDA and  has been authorized for detection and/or diagnosis of SARS-CoV-2 by FDA under an Emergency Use Authorization (EUA). This EUA will remain  in effect (meaning this test can be used) for the duration of the COVID-19 declaration under Section 564(b)(1) of the Act, 21 U.S.C.section 360bbb-3(b)(1), unless the authorization is terminated  or revoked sooner.       Influenza A by PCR NEGATIVE NEGATIVE Final   Influenza B by PCR NEGATIVE NEGATIVE Final    Comment: (NOTE) The Xpert Xpress SARS-CoV-2/FLU/RSV plus assay is intended as an aid in the diagnosis of influenza from Nasopharyngeal swab specimens and should not be used as a sole basis for treatment. Nasal washings and aspirates are unacceptable for Xpert Xpress SARS-CoV-2/FLU/RSV testing.  Fact Sheet for Patients: BloggerCourse.com  Fact Sheet for Healthcare Providers: SeriousBroker.it  This test is not yet approved or cleared by the [place] FDA and has been authorized for detection and/or diagnosis of SARS-CoV-2 by FDA under an Emergency Use Authorization (EUA). This EUA will remain in effect (meaning this test can be used) for the duration of the COVID-19 declaration under Section 564(b)(1) of the Act, 21 U.S.C. section 360bbb-3(b)(1), unless the authorization is terminated or revoked.  Performed at [facility], [address] [place], [place] [address]   Culture, blood (Routine X 2) w Reflex to ID Panel     Status: None (Preliminary result)   Collection Time: 04/14/22  8:58 AM   Specimen: BLOOD LEFT ARM  Result Value Ref Range Status   Specimen Description BLOOD LEFT ARM  Final  Special Requests   Final    BOTTLES DRAWN AEROBIC AND ANAEROBIC Blood Culture adequate volume   Culture   Final    NO GROWTH 2 DAYS Performed at  [facility], [address] [place], [place] [address]    Report Status PENDING  Incomplete  Culture, blood (Routine X 2) w Reflex to ID Panel     Status: None (Preliminary result)   Collection Time: 04/14/22  9:05 AM   Specimen: BLOOD LEFT HAND  Result Value Ref Range Status   Specimen Description BLOOD LEFT HAND  Final   Special Requests   Final    BOTTLES DRAWN AEROBIC AND ANAEROBIC Blood Culture results may not be optimal due to an inadequate volume of blood received in culture bottles   Culture   Final    NO GROWTH 2 DAYS Performed at [facility], [address], [place], [place] [address]    Report Status PENDING  Incomplete  Respiratory (~20 pathogens) panel by PCR     Status: None   Collection Time: 04/14/22 10:12 AM   Specimen: Nasopharyngeal Swab; Respiratory  Result Value Ref Range Status   Adenovirus NOT DETECTED NOT DETECTED Final   Coronavirus 229E NOT DETECTED NOT DETECTED Final    Comment: (NOTE) The Coronavirus on the Respiratory Panel, DOES NOT test for the novel  Coronavirus (2019 nCoV)    Coronavirus HKU1 NOT DETECTED NOT DETECTED Final   Coronavirus NL63 NOT DETECTED NOT DETECTED Final   Coronavirus OC43 NOT DETECTED NOT DETECTED Final   Metapneumovirus NOT DETECTED NOT DETECTED Final   Rhinovirus / Enterovirus NOT DETECTED NOT DETECTED Final   Influenza A NOT DETECTED NOT DETECTED Final   Influenza B NOT DETECTED NOT DETECTED Final   Parainfluenza Virus 1 NOT DETECTED NOT DETECTED Final   Parainfluenza Virus 2 NOT DETECTED NOT DETECTED Final   Parainfluenza Virus 3 NOT DETECTED NOT DETECTED Final   Parainfluenza Virus 4 NOT DETECTED NOT DETECTED Final   Respiratory Syncytial Virus NOT DETECTED NOT DETECTED Final   Bordetella pertussis NOT DETECTED NOT DETECTED Final   Bordetella Parapertussis NOT DETECTED NOT DETECTED Final   Chlamydophila pneumoniae NOT DETECTED NOT DETECTED Final   Mycoplasma pneumoniae NOT DETECTED NOT DETECTED  Final    Comment: Performed at [facility], [place] [place], [place] [address]  Expectorated Sputum Assessment w Gram Stain, Rflx to Resp Cult     Status: None   Collection Time: 04/14/22  2:00 PM   Specimen: Nasopharyngeal Swab; Sputum  Result Value Ref Range Status   Specimen Description EXPECTORATED SPUTUM  Final   Special Requests NONE  Final   Sputum evaluation   Final    THIS SPECIMEN IS ACCEPTABLE FOR SPUTUM CULTURE Performed at [facility], [address], [place], [place] [address]    Report Status 04/14/2022 FINAL  Final  Culture, Respiratory w Gram Stain     Status: None (Preliminary result)   Collection Time: 04/14/22  2:00 PM  Result Value Ref Range Status   Specimen Description   Final    EXPECTORATED SPUTUM Performed at [facility], [address], [place], [place] [address]    Special Requests   Final    NONE Reflexed from [id number] Performed at [facility], [address] [place],  [address]    Gram Stain   Final    NO WBC SEEN MODERATE GRAM NEGATIVE RODS RARE GRAM POSITIVE COCCI IN PAIRS RARE SQUAMOUS EPITHELIAL CELLS  PRESENT    Culture   Final    NO GROWTH < 12 HOURS Performed at [facility], [place] [address] [place], [place] [address]    Report Status PENDING  Incomplete     Time coordinating discharge: Over 30 minutes  SIGNED:   Wyvonnia Dusky, MD  [facility] 04/16/2022, 1:05 PM Pager   If 7PM-7AM, please contact night-coverage

## 2022-04-16 NOTE — TOC Initial Note (Signed)
Transition of Care [facility]) - Initial/Assessment Note    Patient Details  Name: Melissa Weiss MRN: [id number] Date of Birth: 1980-10-04  Transition of Care [facility]) CM/SW Contact:    Beverly Sessions, RN Phone Number: 04/16/2022, 1:53 PM  Clinical Narrative:                   Transition of Care [facility]) Screening Note   Patient Details  Name: Melissa Weiss Date of Birth: 1980/11/09   Transition of Care [facility]) CM/SW Contact:    Beverly Sessions, RN Phone Number: 04/16/2022, 1:54 PM    Transition of Care Department [facility]) has reviewed patient and no TOC needs have been identified at this time. We will continue to monitor patient advancement through interdisciplinary progression rounds. If new patient transition needs arise, please place a TOC consult.         Patient Goals and CMS Choice        Expected Discharge Plan and Services           Expected Discharge Date: 04/16/22                                    Prior Living Arrangements/Services                       Activities of Daily Living Home Assistive Devices/Equipment: None ADL Screening (condition at time of admission) Patient's cognitive ability adequate to safely complete daily activities?: Yes Is the patient deaf or have difficulty hearing?: No Does the patient have difficulty seeing, even when wearing glasses/contacts?: No Does the patient have difficulty concentrating, remembering, or making decisions?: No Patient able to express need for assistance with ADLs?: Yes Does the patient have difficulty dressing or bathing?: No Independently performs ADLs?: Yes (appropriate for developmental age) Does the patient have difficulty walking or climbing stairs?: No Weakness of Legs: None Weakness of Arms/Hands: None  Permission Sought/Granted                  Emotional Assessment              Admission diagnosis:  SOB (shortness of breath) [R06.02] Sepsis due to pneumonia ([place])  [J18.9, A41.9] Community acquired pneumonia of right lower lobe of lung [J18.9] Patient Active Problem List   Diagnosis Date Noted   Sepsis due to pneumonia ([place]) 04/14/2022   History of breast cancer in female 04/14/2022   Wheezing 04/06/2022   Allergic bronchitis with acute exacerbation 04/06/2022   Suspected COVID-19 virus infection 04/06/2022   Fever 04/06/2022   Iron deficiency anemia 04/06/2022   Mass of upper outer quadrant of right breast 11/22/2016   Alopecia 11/22/2016   Vaginal high risk HPV DNA test positive 11/17/2015   Lung nodule 04/26/2014   Breast cancer of upper-outer quadrant of right female breast ([place]) 03/09/2013   NEOPLASMS UNSPEC NATURE BONE SOFT TISSUE&SKIN 09/05/2010   LIPOMA 09/04/2010   ANXIETY STATE NOS 03/07/2007   COLITIS 03/07/2007   ACNE VULGARIS 03/07/2007   PCP:  Owens Loffler, MD Pharmacy:   [id number] [address], [place] AT [place] [address] Phone: [phone] Fax: [phone]  [id number]Phone: [phone]: [phone]    Social Determinants of  Health (SDOH) Interventions    Readmission Risk Interventions     No data to display

## 2022-04-16 NOTE — Progress Notes (Signed)
Discharge instructions reviewed with patient and her father at the bedside. Questions and concerns were addressed. PIV removed with no complications. Patient and father both expressed concerns with patient being discharged today. MD aware. Patient was provided with the patient experience phone number.

## 2022-04-17 ENCOUNTER — Telehealth: Payer: Self-pay
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
## 2022-04-18 LAB — MYCOPLASMA PNEUMONIAE ANTIBODY, IGM: Mycoplasma pneumo IgM: <770 U/mL (ref 0–769)

## 2022-04-18 NOTE — Telephone Encounter (Signed)
Patient will have office visit with Tabitha 04/19/2022 at 9:20.

## 2022-04-18 NOTE — Telephone Encounter (Signed)
Butch Penny,   Can you make sure that she has a follow-up appointment with me for her hospital follow-up.  She has been my patient for many years.

## 2022-04-18 NOTE — Telephone Encounter (Signed)
I'm guessing this was routed to me and pt scheduled with me due to not feeling better? Otherwise hospital follow up appt would be better suited for her primary care provider Dr. Lorelei Pont.

## 2022-04-18 NOTE — Telephone Encounter (Signed)
[place] HE IS HER PCP.

## 2022-04-19 ENCOUNTER — Ambulatory Visit: Payer: Managed Care, Other (non HMO) | Admitting: Family

## 2022-04-19 ENCOUNTER — Encounter: Payer: Self-pay | Admitting: Family

## 2022-04-19 VITALS — BP 108/74 | HR 95 | Temp 97.7°F | Ht 64.0 in | Wt 136.0 lb

## 2022-04-19 DIAGNOSIS — R918 Other nonspecific abnormal finding of lung field: Secondary | ICD-10-CM | POA: Diagnosis not present

## 2022-04-19 DIAGNOSIS — R062 Wheezing: Secondary | ICD-10-CM

## 2022-04-19 DIAGNOSIS — D509 Iron deficiency anemia, unspecified: Secondary | ICD-10-CM | POA: Diagnosis not present

## 2022-04-19 DIAGNOSIS — R0602 Shortness of breath: Secondary | ICD-10-CM | POA: Diagnosis not present

## 2022-04-19 DIAGNOSIS — J189 Pneumonia, unspecified organism: Secondary | ICD-10-CM | POA: Diagnosis not present

## 2022-04-19 DIAGNOSIS — J4541 Moderate persistent asthma with (acute) exacerbation: Secondary | ICD-10-CM

## 2022-04-19 DIAGNOSIS — A419 Sepsis, unspecified organism: Secondary | ICD-10-CM

## 2022-04-19 DIAGNOSIS — R911 Solitary pulmonary nodule: Secondary | ICD-10-CM

## 2022-04-19 LAB — CULTURE, BLOOD (ROUTINE X 2)
Culture: NO GROWTH
Culture: NO GROWTH
Special Requests: ADEQUATE

## 2022-04-19 LAB — IBC + FERRITIN
Ferritin: 10.6 ng/mL (ref 10.0–291.0)
Iron: 84 ug/dL (ref 42–145)
Saturation Ratios: 22.1 % (ref 20.0–50.0)
TIBC: 379.4 ug/dL (ref 250.0–450.0)
Transferrin: 271 mg/dL (ref 212.0–360.0)

## 2022-04-19 LAB — CBC WITH DIFFERENTIAL/PLATELET
Basophils Absolute: 0 10*3/uL (ref 0.0–0.1)
Basophils Relative: 0.3 % (ref 0.0–3.0)
Eosinophils Absolute: 0.3 10*3/uL (ref 0.0–0.7)
Eosinophils Relative: 4.7 % (ref 0.0–5.0)
HCT: 39.6 % (ref 36.0–46.0)
Hemoglobin: 13.3 g/dL (ref 12.0–15.0)
Lymphocytes Relative: 15.4 % (ref 12.0–46.0)
Lymphs Abs: 1 10*3/uL (ref 0.7–4.0)
MCHC: 33.7 g/dL (ref 30.0–36.0)
MCV: 86.5 fl (ref 78.0–100.0)
Monocytes Absolute: 0.5 10*3/uL (ref 0.1–1.0)
Monocytes Relative: 7.5 % (ref 3.0–12.0)
Neutro Abs: 4.9 10*3/uL (ref 1.4–7.7)
Neutrophils Relative %: 72.1 % (ref 43.0–77.0)
Platelets: 266 10*3/uL (ref 150.0–400.0)
RBC: 4.58 Mil/uL (ref 3.87–5.11)
RDW: 14.5 % (ref 11.5–15.5)
WBC: 6.7 10*3/uL (ref 4.0–10.5)

## 2022-04-19 MED ORDER — PREDNISONE 20 MG PO TABS: ORAL_TABLET | ORAL | 5 refills | Status: DC

## 2022-04-19 MED ORDER — ALBUTEROL SULFATE HFA 108 (90 BASE) MCG/ACT IN AERS: 2.0000 | INHALATION_SPRAY | Freq: Four times a day (QID) | RESPIRATORY_TRACT | 0 refills | Status: DC | PRN

## 2022-04-19 MED ORDER — FLUTICASONE-SALMETEROL 250-50 MCG/ACT IN AEPB: 1.0000 | INHALATION_SPRAY | Freq: Two times a day (BID) | RESPIRATORY_TRACT | 0 refills | Status: DC

## 2022-04-19 NOTE — Assessment & Plan Note (Signed)
Stop combivent  Start albuterol prn  Start advair 250/50 mcg

## 2022-04-19 NOTE — Progress Notes (Signed)
Established Patient Office Visit  Subjective:  Patient ID: Melissa Weiss, female    DOB: [birth year]  Age: 41 y.o. MRN: [id number]  CC:  Chief Complaint  Patient presents with   Hospitalization Follow-up    Still having some wheezing and shortness of breath.     HPI Melissa Weiss is here for hospital follow up.   Hospital: [facility]: Sent to the emergency room on September 9 Discharge: April 16, 2022 Discharge dx: Sepsis due to pneumonia Discharge medications: Azithromycin 500 mg oral daily as well as ipratropium albuterol 1 puff every 6 hours as needed  Recommendations upon discharge were to follow-up with PCP in 1 to 2 weeks and recommendations for a follow-up chest x-ray in 6 weeks to to confirm the resolution of the pneumonia.  Was seen in the office on September 9 and sent to the emergency room by me for shortness of breath and worsening dyspnea.  She had been started on doxycycline as well as Augmentin and prednisone with no improvement so then she was switched to Levaquin 500 mg once daily.  Symptoms began to progress even after these were given.  Had a fever that went up to 100.4 Fahrenheit.  In the emergency room troponin was negative and lactic acid was 1.5 with a normal CBC.  He was tested for flu and COVID which both were negative.  Chest x-ray in hospital with no acute cardiopulmonary disease however the chest CTA revealed patchy ill-defined nodular airspace opacities in right lower lobe and minimally in the right upper lobe suggesting pneumonia.  EKG in hospital with normal sinus rhythm with a rate of 95 bpm In hospital patient given IV Zithromax and IV Rocephin.  She was also administered Decadron and 2 DuoNebs along with 4 mg of Zofran and 1 bolus of normal saline.  She was admitted to telemetry. Blood cultures were negative.   Today in office, she states she is using combivent every 6 hours prn bronchopasm, she finds she is using it at every  6 hours. Still with wheezing, still feeling sob. Very tight in her chest and hard to take a deep breath. Slight improvement from yesterday.   In the er 9/10 with some new anemia, that was dropping. Has had iron infusions in the past with hematology, last iron infusion was three months ago. She is due for f/u with hematology as well.    Past Medical History:  Diagnosis Date   Anxiety state, unspecified    Breast cancer of upper-outer quadrant of right female breast ([place]) 03/09/2013   Colitis    Colon polyp    benign   Folliculitis 2/94   pseudomonas   H/O urticaria    Lipoma of unspecified site    Neoplasm of unspecified nature of bone, soft tissue, and skin    Ovarian cyst 2000   S/P radiation therapy 04/28/2013-06/11/2013   Right breast / 45 Gray @ 1.8 [place] Cables per fraction x 25 fractions/Right breast boost / 16 Gray at [employer] per fraction x 8 fractions     Past Surgical History:  Procedure Laterality Date   BREAST LUMPECTOMY Bilateral    COLONOSCOPY  6/00   few polyps; bx neg   ESOPHAGOGASTRODUODENOSCOPY  7/00   Neg   FLEXIBLE SIGMOIDOSCOPY  3/01   Neg   TONSILLECTOMY  1998    History reviewed. No pertinent family history.  Social History   Socioeconomic History   Marital status: Married    Spouse name:  Not on file   Number of children: 1   Years of education: Not on file   Highest education level: Not on file  Occupational History   Not on file  Tobacco Use   Smoking status: Never   Smokeless tobacco: Never  Substance and Sexual Activity   Alcohol use: No    Alcohol/week: 0.0 standard drinks of alcohol   Drug use: No   Sexual activity: Not Currently  Other Topics Concern   Not on file  Social History Narrative   Married; 1 child   Social Determinants of Health   Financial Resource Strain: Not on file  Food Insecurity: No Food Insecurity (04/15/2022)   Hunger Vital Sign    Worried About Running Out of Food in the Last Year: Never true    Ran Out of Food in  the Last Year: Never true  Transportation Needs: No Transportation Needs (04/15/2022)   PRAPARE - Hydrologist (Medical): No    Lack of Transportation (Non-Medical): No  Physical Activity: Not on file  Stress: Not on file  Social Connections: Not on file  Intimate Partner Violence: Not At Risk (04/15/2022)   Humiliation, Afraid, Rape, and Kick questionnaire    Fear of Current or Ex-Partner: No    Emotionally Abused: No    Physically Abused: No    Sexually Abused: No    Outpatient Medications Prior to Visit  Medication Sig Dispense Refill   Ipratropium-Albuterol (COMBIVENT) 20-100 MCG/ACT AERS respimat Inhale 1 puff into the lungs every 6 (six) hours as needed for wheezing or shortness of breath. 4 g 0   ALPRAZolam (XANAX) 0.25 MG tablet Take 1 tablet (0.25 mg total) by mouth 2 (two) times daily as needed for up to 7 days for anxiety. 14 tablet 0   Facility-Administered Medications Prior to Visit  Medication Dose Route Frequency Provider Last Rate Last Admin   hyaluronate sodium (RADIAPLEXRX) gel   Topical Once Thea Silversmith, MD       non-metallic deodorant Jethro Poling) 1 application  1 application  Topical Once Thea Silversmith, MD        Allergies  Allergen Reactions   Codeine Nausea Only   Erythromycin Nausea Only   Ferrous Sulfate Nausea Only   Lidocaine Other (See Comments)    Swelling in mouth    Vicodin [Hydrocodone-Acetaminophen] Nausea And Vomiting   Epinephrine Palpitations       Objective:    Physical Exam Constitutional:      General: She is not in acute distress.    Appearance: Normal appearance. She is obese. She is ill-appearing (appears weak).  HENT:     Right Ear: Tympanic membrane normal.     Left Ear: Tympanic membrane normal.     Nose: Nose normal. No congestion or rhinorrhea.     Right Turbinates: Not enlarged or swollen.     Left Turbinates: Not enlarged or swollen.     Right Sinus: No maxillary sinus tenderness or  frontal sinus tenderness.     Left Sinus: No maxillary sinus tenderness or frontal sinus tenderness.     Mouth/Throat:     Mouth: Mucous membranes are moist.     Pharynx: No pharyngeal swelling, oropharyngeal exudate or posterior oropharyngeal erythema.     Tonsils: No tonsillar exudate.  Eyes:     Extraocular Movements: Extraocular movements intact.     Conjunctiva/sclera: Conjunctivae normal.     Pupils: Pupils are equal, round, and reactive to light.  Neck:  Thyroid: No thyroid mass.  Cardiovascular:     Rate and Rhythm: Normal rate and regular rhythm.  Pulmonary:     Effort: Pulmonary effort is normal. No tachypnea, accessory muscle usage or respiratory distress.     Breath sounds: Normal breath sounds.  Lymphadenopathy:     Cervical:     Right cervical: No superficial cervical adenopathy.    Left cervical: No superficial cervical adenopathy.  Neurological:     Mental Status: She is alert.     BP 108/74   Pulse 95   Temp 97.7 F (36.5 C) (Oral)   Ht _0  (1.626 m)   Wt 136 lb (61.7 kg)   LMP 04/02/2022   SpO2 99%   BMI 23.34 kg/m  Wt Readings from Last 3 Encounters:  04/19/22 136 lb (61.7 kg)  04/14/22 139 lb (63 kg)  04/06/22 138 lb 4 oz (62.7 kg)     Health Maintenance Due  Topic Date Due   COVID-19 Vaccine (1) Never done   Hepatitis C Screening  Never done   PAP SMEAR-Modifier  07/04/2018    There are no preventive care reminders to display for this patient.  Lab Results  Component Value Date   TSH 0.66 11/22/2016   Lab Results  Component Value Date   WBC 6.3 04/16/2022   HGB 10.9 (L) 04/16/2022   HCT 33.1 (L) 04/16/2022   MCV 87.6 04/16/2022   PLT 221 04/16/2022   Lab Results  Component Value Date   NA 141 04/16/2022   K 3.9 04/16/2022   CHLORIDE 107 02/18/2015   CO2 26 04/16/2022   GLUCOSE 95 04/16/2022   BUN 15 04/16/2022   CREATININE 0.76 04/16/2022   BILITOT 0.7 03/06/2022   ALKPHOS 58 03/06/2022   AST 18 03/06/2022   ALT 13  03/06/2022   PROT 7.2 03/06/2022   ALBUMIN 3.9 03/06/2022   CALCIUM 8.6 (L) 04/16/2022   ANIONGAP 7 04/16/2022   EGFR >90 02/18/2015   No results found for: "CHOL" No results found for: "HDL" No results found for: "LDLCALC" No results found for: "TRIG" No results found for: "CHOLHDL" No results found for: "HGBA1C"    Assessment & Plan:   Problem List Items Addressed This Visit       Respiratory   Sepsis due to pneumonia [facility])    Resolved however patient still with chronic symptoms. Repeat CT ordered for 3 months from now. Patient advised to schedule  Did advise pt if any worsening sob or any start of new chest pain please go to er and or call 911      Relevant Medications   fluticasone-salmeterol (ADVAIR) 250-50 MCG/ACT AEPB   albuterol (VENTOLIN HFA) 108 (90 Base) MCG/ACT inhaler   Other Relevant Orders   CT Chest Wo Contrast   Ambulatory referral to Pulmonology   Lung nodules    We will repeat CT of the chest in 3 months      Relevant Orders   CT Chest Wo Contrast   Ambulatory referral to Pulmonology   RESOLVED: Lung nodule    We will repeat CT of the chest in 3 months        Other   Wheezing    Stop combivent  Start albuterol prn  Start advair 250/50 mcg       Iron deficiency anemia - Primary    Repeat CBC today as well as IBC and ferritin pending results patient to make follow-up with hematologist as she was sick prior may  need another iron infusion.       Relevant Orders   CBC with Differential   IBC + Ferritin   RESOLVED: Shortness of breath   Relevant Medications   predniSONE (DELTASONE) 20 MG tablet   fluticasone-salmeterol (ADVAIR) 250-50 MCG/ACT AEPB   albuterol (VENTOLIN HFA) 108 (90 Base) MCG/ACT inhaler   Other Relevant Orders   CT Chest Wo Contrast   Ambulatory referral to Pulmonology   Other Visit Diagnoses     Moderate persistent reactive airway disease with acute exacerbation       Relevant Medications   predniSONE  (DELTASONE) 20 MG tablet   fluticasone-salmeterol (ADVAIR) 250-50 MCG/ACT AEPB   albuterol (VENTOLIN HFA) 108 (90 Base) MCG/ACT inhaler       Meds ordered this encounter  Medications   predniSONE (DELTASONE) 20 MG tablet    Sig: Take two tablets po qd for five days    Dispense:  10 tablet    Refill:  5    Order Specific Question:   Supervising Provider    Answer:   BEDSOLE, AMY E [[id number]]   fluticasone-salmeterol (ADVAIR) 250-50 MCG/ACT AEPB    Sig: Inhale 1 puff into the lungs in the morning and at bedtime.    Dispense:  60 each    Refill:  0    Order Specific Question:   Supervising Provider    Answer:   BEDSOLE, AMY E [[id number]]   albuterol (VENTOLIN HFA) 108 (90 Base) MCG/ACT inhaler    Sig: Inhale 2 puffs into the lungs every 6 (six) hours as needed for wheezing or shortness of breath.    Dispense:  8 g    Refill:  0    Order Specific Question:   Supervising Provider    Answer:   Diona Browner, AMY E [[id number]]    Follow-up: Return in about 2 weeks (around 05/03/2022) for f/u with Dr. Lorelei Pont (PCP) for ongoing sob .    Eugenia Pancoast, FNP

## 2022-04-19 NOTE — Assessment & Plan Note (Addendum)
Repeat CBC today as well as IBC and ferritin pending results patient to make follow-up with hematologist as she was sick prior may need another iron infusion.

## 2022-04-19 NOTE — Assessment & Plan Note (Signed)
We will repeat CT of the chest in 3 months

## 2022-04-19 NOTE — Patient Instructions (Signed)
Recommend daily flonase.   Continue with combivent when needed for now will work on sending new inhaler for maintenance, which will be daily dosing instead.  Due to recent changes in healthcare laws, you may see results of your imaging and/or laboratory studies on MyChart before I have had a chance to review them.  I understand that in some cases there may be results that are confusing or concerning to you. Please understand that not all results are received at the same time and often I may need to interpret multiple results in order to provide you with the best plan of care or course of treatment. Therefore, I ask that you please give me 2 business days to thoroughly review all your results before contacting my office for clarification. Should we see a critical lab result, you will be contacted sooner.   It was a pleasure seeing you today! Please do not hesitate to reach out with any questions and or concerns.  Regards,   Eugenia Pancoast FNP-C

## 2022-04-19 NOTE — Assessment & Plan Note (Addendum)
Resolved however patient still with chronic symptoms. Repeat CT ordered for 3 months from now. Patient advised to schedule  Did advise pt if any worsening sob or any start of new chest pain please go to er and or call 911

## 2022-05-15 ENCOUNTER — Institutional Professional Consult (permissible substitution): Payer: Managed Care, Other (non HMO) | Admitting: Pulmonary Disease

## 2022-06-26 ENCOUNTER — Encounter: Payer: Self-pay | Admitting: *Deleted

## 2022-06-26 ENCOUNTER — Institutional Professional Consult (permissible substitution): Payer: Managed Care, Other (non HMO) | Admitting: Pulmonary Disease

## 2022-07-16 ENCOUNTER — Ambulatory Visit: Payer: Managed Care, Other (non HMO)

## 2022-07-16 ENCOUNTER — Telehealth: Payer: Self-pay
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
## 2022-07-26 ENCOUNTER — Encounter: Payer: Self-pay | Admitting: Family Medicine

## 2022-07-26 ENCOUNTER — Ambulatory Visit: Payer: Managed Care, Other (non HMO) | Admitting: Family Medicine

## 2022-07-26 VITALS — BP 102/62 | HR 94 | Temp 97.3°F | Ht 64.0 in | Wt 140.0 lb

## 2022-07-26 DIAGNOSIS — R202 Paresthesia of skin: Secondary | ICD-10-CM | POA: Diagnosis not present
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
## 2022-07-26 NOTE — Patient Instructions (Signed)
Normal exam today.  Update [place] as needed.  Take care.  Glad to see you.

## 2022-07-29 DIAGNOSIS — R202 Paresthesia of skin: Secondary | ICD-10-CM | POA: Insufficient documentation
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
## 2022-11-23 ENCOUNTER — Ambulatory Visit (INDEPENDENT_AMBULATORY_CARE_PROVIDER_SITE_OTHER): Payer: Managed Care, Other (non HMO) | Admitting: Family Medicine

## 2022-11-23 ENCOUNTER — Other Ambulatory Visit: Payer: Self-pay | Admitting: Family Medicine

## 2022-11-23 VITALS — BP 96/70 | HR 90 | Temp 97.7°F | Ht 64.0 in | Wt 141.1 lb

## 2022-11-23 DIAGNOSIS — D509 Iron deficiency anemia, unspecified: Secondary | ICD-10-CM

## 2022-11-23 DIAGNOSIS — D500 Iron deficiency anemia secondary to blood loss (chronic): Secondary | ICD-10-CM

## 2022-11-23 DIAGNOSIS — R5383 Other fatigue: Secondary | ICD-10-CM | POA: Insufficient documentation

## 2022-11-23 LAB — IBC + FERRITIN
Ferritin: 3.7 ng/mL — ABNORMAL LOW (ref 10.0–291.0)
Iron: 22 ug/dL — ABNORMAL LOW (ref 42–145)
Saturation Ratios: 5.1 % — ABNORMAL LOW (ref 20.0–50.0)
TIBC: 428.4 ug/dL (ref 250.0–450.0)
Transferrin: 306 mg/dL (ref 212.0–360.0)

## 2022-11-23 LAB — COMPREHENSIVE METABOLIC PANEL
ALT: 10 U/L (ref 0–35)
AST: 15 U/L (ref 0–37)
Albumin: 4.1 g/dL (ref 3.5–5.2)
Alkaline Phosphatase: 63 U/L (ref 39–117)
BUN: 11 mg/dL (ref 6–23)
CO2: 29 mEq/L (ref 19–32)
Calcium: 9.2 mg/dL (ref 8.4–10.5)
Chloride: 103 mEq/L (ref 96–112)
Creatinine, Ser: 0.66 mg/dL (ref 0.40–1.20)
GFR: 107.03 mL/min (ref >60.00)
Glucose, Bld: 83 mg/dL (ref 70–99)
Potassium: 4 mEq/L (ref 3.5–5.1)
Sodium: 140 mEq/L (ref 135–145)
Total Bilirubin: 0.4 mg/dL (ref 0.2–1.2)
Total Protein: 6.8 g/dL (ref 6.0–8.3)

## 2022-11-23 LAB — CBC WITH DIFFERENTIAL/PLATELET
Basophils Absolute: 0 10*3/uL (ref 0.0–0.1)
Basophils Relative: 0.5 % (ref 0.0–3.0)
Eosinophils Absolute: 0 10*3/uL (ref 0.0–0.7)
Eosinophils Relative: 1.1 % (ref 0.0–5.0)
HCT: 34.3 % — ABNORMAL LOW (ref 36.0–46.0)
Hemoglobin: 11.6 g/dL — ABNORMAL LOW (ref 12.0–15.0)
Lymphocytes Relative: 18.8 % (ref 12.0–46.0)
Lymphs Abs: 0.8 10*3/uL (ref 0.7–4.0)
MCHC: 33.9 g/dL (ref 30.0–36.0)
MCV: 79.8 fl (ref 78.0–100.0)
Monocytes Absolute: 0.3 10*3/uL (ref 0.1–1.0)
Monocytes Relative: 7.6 % (ref 3.0–12.0)
Neutro Abs: 3.2 10*3/uL (ref 1.4–7.7)
Neutrophils Relative %: 72 % (ref 43.0–77.0)
Platelets: 257 10*3/uL (ref 150.0–400.0)
RBC: 4.3 Mil/uL (ref 3.87–5.11)
RDW: 15.6 % — ABNORMAL HIGH (ref 11.5–15.5)
WBC: 4.5 10*3/uL (ref 4.0–10.5)

## 2022-11-23 LAB — VITAMIN B12: Vitamin B-12: 262 pg/mL (ref 211–911)

## 2022-11-23 LAB — TSH: TSH: 0.82 u[IU]/mL (ref 0.35–5.50)

## 2022-11-23 LAB — VITAMIN D 25 HYDROXY (VIT D DEFICIENCY, FRACTURES): VITD: 23.64 ng/mL — ABNORMAL LOW (ref 30.00–100.00)

## 2022-11-23 MED ORDER — VITAMIN D (ERGOCALCIFEROL) 1.25 MG (50000 UNIT) PO CAPS
50000.0000 [IU] | ORAL_CAPSULE | ORAL | 0 refills | Status: AC
Start: 2022-11-23 — End: ?

## 2022-11-23 NOTE — Assessment & Plan Note (Signed)
Acute, concerning for recurrent iron deficiency anemia with continued menorrhagia. Patient with history of breast cancer, recurrent but not currently seeing oncologist.  Will evaluate with labs.  Patient interested in iron infusion if ferritin continues to be low.

## 2022-11-23 NOTE — Progress Notes (Signed)
Patient ID: Melissa Weiss, female    DOB: 22-Jul-1981, 42 y.o.   MRN: [id number]  This visit was conducted in person.  BP 96/70   Pulse 90   Temp 97.7 F (36.5 C) (Temporal)   Ht  (1.626 m)   Wt 141 lb 2 oz (64 kg)   LMP 11/13/2022   SpO2 98%   BMI 24.22 kg/m    CC:  Chief Complaint  Patient presents with   Fatigue   Dizziness    Subjective:   HPI: Melissa Weiss is a 42 y.o. female patient of Dr. Adele Schilder presenting on 11/23/2022 for Fatigue and Dizziness   Had been doing well, on daily vitamin with iron.   New onset fatigue noted in last 2 weeks later in the day. Has noted increase heart rate. No SOB.  Mild cold intolerance, no weight change.    Has occ off balance feeling, woozy, no vertigo.  Continued menorrhagia... had been recommended to do TAH. No blood in stool.  Has history of iron deficiency anemia. Last Hg 10.2  07/11/22  Given iron infusion  Ferritin 4 in 07/02/22  Hx of breast caner, recuurrent.. followed by  Oncology... ever since she has issues with anemia.  History of iron infusion.  Relevant past medical, surgical, family and social history reviewed and updated as indicated. Interim medical history since our last visit reviewed. Allergies and medications reviewed and updated. Outpatient Medications Prior to Visit  Medication Sig Dispense Refill   Multiple Vitamins-Minerals (MULTIVITAMIN WITH MINERALS) tablet Take 1 tablet by mouth daily.     Facility-Administered Medications Prior to Visit  Medication Dose Route Frequency Provider Last Rate Last Admin   hyaluronate sodium (RADIAPLEXRX) gel   Topical Once Lurline Hare, MD       non-metallic deodorant Thornton Papas) 1 application  1 application  Topical Once Lurline Hare, MD         Per HPI unless specifically indicated in ROS section below Review of Systems  Constitutional:  Positive for fatigue. Negative for fever.  HENT:  Negative for congestion.   Eyes:  Negative for pain.   Respiratory:  Negative for cough and shortness of breath.   Cardiovascular:  Negative for chest pain and leg swelling.  Gastrointestinal:  Negative for abdominal pain.  Genitourinary:  Negative for dysuria and vaginal bleeding.  Musculoskeletal:  Negative for back pain.  Neurological:  Negative for syncope, light-headedness and headaches.  Psychiatric/Behavioral:  Negative for dysphoric mood.    Objective:  BP 96/70   Pulse 90   Temp 97.7 F (36.5 C) (Temporal)   Ht  (1.626 m)   Wt 141 lb 2 oz (64 kg)   LMP 11/13/2022   SpO2 98%   BMI 24.22 kg/m   Wt Readings from Last 3 Encounters:  11/23/22 141 lb 2 oz (64 kg)  07/26/22 140 lb (63.5 kg)  04/19/22 136 lb (61.7 kg)      Physical Exam Constitutional:      General: She is not in acute distress.    Appearance: Normal appearance. She is well-developed. She is not ill-appearing or toxic-appearing.  HENT:     Head: Normocephalic.     Right Ear: Hearing, tympanic membrane, ear canal and external ear normal. Tympanic membrane is not erythematous, retracted or bulging.     Left Ear: Hearing, tympanic membrane, ear canal and external ear normal. Tympanic membrane is not erythematous, retracted or bulging.     Nose: No mucosal edema or  rhinorrhea.     Right Sinus: No maxillary sinus tenderness or frontal sinus tenderness.     Left Sinus: No maxillary sinus tenderness or frontal sinus tenderness.     Mouth/Throat:     Pharynx: Uvula midline.  Eyes:     General: Lids are normal. Lids are everted, no foreign bodies appreciated.     Conjunctiva/sclera: Conjunctivae normal.     Pupils: Pupils are equal, round, and reactive to light.  Neck:     Thyroid: No thyroid mass or thyromegaly.     Vascular: No carotid bruit.     Trachea: Trachea normal.  Cardiovascular:     Rate and Rhythm: Normal rate and regular rhythm.     Pulses: Normal pulses.     Heart sounds: Normal heart sounds, S1 normal and S2 normal. No murmur heard.    No  friction rub. No gallop.  Pulmonary:     Effort: Pulmonary effort is normal. No tachypnea or respiratory distress.     Breath sounds: Normal breath sounds. No decreased breath sounds, wheezing, rhonchi or rales.  Abdominal:     General: Bowel sounds are normal.     Palpations: Abdomen is soft.     Tenderness: There is no abdominal tenderness.  Musculoskeletal:     Cervical back: Normal range of motion and neck supple.  Skin:    General: Skin is warm and dry.     Findings: No rash.  Neurological:     Mental Status: She is alert.  Psychiatric:        Mood and Affect: Mood is not anxious or depressed.        Speech: Speech normal.        Behavior: Behavior normal. Behavior is cooperative.        Thought Content: Thought content normal.        Judgment: Judgment normal.       Results for orders placed or performed in visit on 11/23/22  HM PAP SMEAR  Result Value Ref Range   HM Pap smear See report in Care Everywhere   Results Console HPV  Result Value Ref Range   CHL HPV Positive     Assessment and Plan  Other fatigue Assessment & Plan: Acute, concerning for recurrent iron deficiency anemia with continued menorrhagia. Patient with history of breast cancer, recurrent but not currently seeing oncologist.  Will evaluate with labs.  Patient interested in iron infusion if ferritin continues to be low.  Orders: -     CBC with Differential/Platelet -     IBC + Ferritin -     Comprehensive metabolic panel -     TSH -     Vitamin B12 -     VITAMIN D 25 Hydroxy (Vit-D Deficiency, Fractures)  Iron deficiency anemia, unspecified iron deficiency anemia type    No follow-ups on file.   Kerby Nora, MD

## 2022-11-26 ENCOUNTER — Telehealth: Payer: Self-pay | Admitting: Family Medicine

## 2022-11-26 NOTE — Telephone Encounter (Signed)
[facility]  is requesting a copy of patients labs that she had drawn on 11/23/2022. She has to have an iron transfusion there,and they are needing these results as soon as possible to try and get her scheduled tomorrow.  Fax number#[phone] Attn :Adelina Mings

## 2022-11-26 NOTE — Telephone Encounter (Signed)
Lab results faxed to [facility] at [phone] as requested.  They can also review labs in Care Everywhere.

## 2022-11-27 ENCOUNTER — Telehealth: Payer: Self-pay | Admitting: Family Medicine

## 2022-11-27 NOTE — Telephone Encounter (Signed)
Yes, can you help get her on the nurse schedule for B12 injections? Re: B12 deficiency.   Will need to set up with the patient.

## 2022-11-27 NOTE — Telephone Encounter (Signed)
Tressie Ellis, NP from [facility] contacted the office regarding her recent visit with this patient, video visit on 4/23. States she ordered an iron infusion for the patient to have, also states that she recommended B12 injections for this patient, wants to know if these can be given at our office and ordered by Dr. Patsy Lager since he is her pcp. Informed her I would send a message regarding this and we would contact the patient from there.  If needed to reach provider, please advise [phone] ext. [phone].

## 2022-11-28 ENCOUNTER — Ambulatory Visit: Payer: Managed Care, Other (non HMO) | Admitting: Family Medicine

## 2022-11-28 ENCOUNTER — Encounter: Payer: Self-pay | Admitting: Family Medicine

## 2022-11-28 VITALS — BP 100/68 | HR 112 | Temp 98.2°F | Ht 64.0 in | Wt 139.5 lb

## 2022-11-28 DIAGNOSIS — E538 Deficiency of other specified B group vitamins: Secondary | ICD-10-CM | POA: Diagnosis not present

## 2022-11-28 DIAGNOSIS — R051 Acute cough: Secondary | ICD-10-CM

## 2022-11-28 DIAGNOSIS — J208 Acute bronchitis due to other specified organisms: Secondary | ICD-10-CM

## 2022-11-28 LAB — POC INFLUENZA A&B (BINAX/QUICKVUE)
Influenza A, POC: NEGATIVE
Influenza B, POC: NEGATIVE

## 2022-11-28 LAB — POC COVID19 BINAXNOW: SARS Coronavirus 2 Ag: NEGATIVE

## 2022-11-28 MED ORDER — CYANOCOBALAMIN 1000 MCG/ML IJ SOLN
1000.0000 ug | Freq: Once | INTRAMUSCULAR | Status: AC
Start: 2022-11-28 — End: 2022-11-28
  Administered 2022-11-28: 1000 ug via INTRAMUSCULAR

## 2022-11-28 MED ORDER — DOXYCYCLINE HYCLATE 100 MG PO TABS
100.0000 mg | ORAL_TABLET | Freq: Two times a day (BID) | ORAL | 0 refills | Status: DC
Start: 2022-11-28 — End: 2023-01-22

## 2022-11-28 NOTE — Patient Instructions (Signed)
Flonase - 2 sprays once a day  Afrin - 2 sprays twice a day

## 2022-11-28 NOTE — Progress Notes (Unsigned)
    Annikah Lovins T. Hannie Shoe, MD, CAQ Sports Medicine [facility] at [facility] [address] [place], [address]  Phone: [phone]  FAX: [phone]  CYNTHIA COGLE - 42 y.o. female  MRN [id number]  Date of Birth: 29-Jun-1981  Date: 11/28/2022  PCP: [name], MD  Referral: [name], MD  Chief Complaint  Patient presents with   Cough    With green phlegm   B12 Injection   Subjective:   Melissa Weiss is a 42 y.o. very pleasant female patient with Body mass index is 23.95 kg/m. who presents with the following:  Had been doing fine.   Thinks that she picked something worse.   Feels really weeak.  Chest is burning.  R sided rib pain.  Green prod sputum with coughing.  Thought that she may have heard some wheezing.    99-100 temp.  Does not feel like doing much right now.  Ears are full of fluid.   Had sepsis and pna back in August.  Feels like that right now.  Has been on different abx and IV abx.  No known covid or flu exposure.  Achy all over.   Has been taking a women's gummy -  Get b12 injection    Review of Systems is noted in the HPI, as appropriate  Objective:   BP 100/68   Pulse (!) 112   Temp 98.2 F (36.8 C) (Temporal)   Ht  (1.626 m)   Wt 139 lb 8 oz (63.3 kg)   LMP 11/13/2022   SpO2 99%   BMI 23.95 kg/m   GEN: No acute distress; alert,appropriate. PULM: Breathing comfortably in no respiratory distress PSYCH: Normally interactive.   Laboratory and Imaging Data:  Assessment and Plan:   ***

## 2022-11-28 NOTE — Telephone Encounter (Signed)
Spoke with Melissa Weiss.  She is currently sick and is requesting office visit for cough.  Appointment scheduled this afternoon with Dr. Patsy Lager at 2:00 pm.  Can start B12 injections today as well but need to know frequency of injections to schedule future nurse visits.  Please advise.

## 2022-11-29 ENCOUNTER — Telehealth: Payer: Self-pay | Admitting: Family Medicine

## 2022-11-29 ENCOUNTER — Encounter: Payer: Self-pay | Admitting: Family Medicine

## 2022-11-29 NOTE — Telephone Encounter (Signed)
Patient would like to know if she should still take vitamin D since she received the b12 shot as well?

## 2022-11-30 NOTE — Telephone Encounter (Signed)
Please call  Vit D and B12 are different vitamins, so yes.  Her Vit D level looks low, and she should take 2,000 units each day.

## 2022-11-30 NOTE — Telephone Encounter (Signed)
Melissa Weiss notified as instructed by telephone.  Dr. Ermalene Searing recently prescribed the high dose Vit D so I advised to complete the 12 week course of that, then switch over to OTC 2000 IU daily.  She states today her ears started hurting and was asking if the Doxycycline would cover a ear infection.  She also states she has some prednisone from her ENT and wants to start taking that to help with the fluid in her ears since she is flying out on vacation tomorrow.  She just wanted to make sure it is okay to take the prednisone along with the doxycycline.  She also ask if she should use the 2 nose sprays that were recommended for her together or separate. I advised that doxycyline would cover a ear infection and that it is okay to take prednisone and doxycline together. I did recommend using the nasal sprays at separate times during the day.  Will forward to Dr. Patsy Lager to review and make any further recommendations if needed.

## 2022-12-12 ENCOUNTER — Telehealth: Payer: Self-pay | Admitting: Family Medicine

## 2022-12-12 NOTE — Telephone Encounter (Signed)
Since Dr. Para March did all DMV paperwork and handled this case a few months ago, I think it is appropriate for him to complete the case.

## 2022-12-12 NOTE — Telephone Encounter (Signed)
Paperwork was place in PCP folder pt has not been seen by Dr Para March in a year . Please advise # [phone]

## 2022-12-12 NOTE — Telephone Encounter (Signed)
Pt called in stated she emailed paperwork for Dr Para March from the [facility] .

## 2022-12-12 NOTE — Telephone Encounter (Signed)
PPW is in Dr. Lianne Bushy inbox for review.

## 2022-12-12 NOTE — Telephone Encounter (Addendum)
Please let her know I'll look at the hard copy when I am back in clinic, when possible.  Please make sure the hard copy is in my inbox. Thanks.

## 2022-12-14 NOTE — Telephone Encounter (Signed)
Pt called in to know status of paperwork [phone]

## 2022-12-16 NOTE — Telephone Encounter (Signed)
Please make sure the letter printed and pull it for me to sign.  Please put it with the paperwork and send the patient so she can submit it.  Thanks.

## 2022-12-17 NOTE — Telephone Encounter (Signed)
Letter printed and awaiting Dr. Para March signature.

## 2022-12-17 NOTE — Telephone Encounter (Signed)
Letter signed and notified patient that letter is ready for pickup. Patient will pick up tomorrow; letter placed up front.

## 2022-12-27 ENCOUNTER — Telehealth: Payer: Self-pay | Admitting: Family Medicine

## 2022-12-27 NOTE — Telephone Encounter (Signed)
Spoke with Melissa Weiss and ask her to be here at 12:30 pm and I would do her B12 injection for her.   JoEllen,  Can you open up a slot on the nurse schedule tomorrow for [place]?

## 2022-12-27 NOTE — Telephone Encounter (Signed)
Patient contacted the office regarding nurse visit for B12 on 5/28. Patient was requesting to come in tomorrow rather than Tuesday. Advised patient we do not do nurse visits on monday and Friday. Patient states due to work conflict she is not able to make it to the office at all on Tuesday, Wednesday or Thursday. Patient asked if there was any way to have this done on 5/24 rather than 5/28. Advised patient again our office does not do nurse visits on Monday or Friday but I will send a message to see if we could add her on for tomorrow, patient says she could make it in anytime after 11 am. Please advise, thank you.

## 2022-12-27 NOTE — Telephone Encounter (Signed)
Called patient-voicemail not set up

## 2022-12-28 ENCOUNTER — Ambulatory Visit (INDEPENDENT_AMBULATORY_CARE_PROVIDER_SITE_OTHER): Payer: Managed Care, Other (non HMO) | Admitting: *Deleted

## 2022-12-28 DIAGNOSIS — E538 Deficiency of other specified B group vitamins: Secondary | ICD-10-CM

## 2022-12-28 MED ORDER — CYANOCOBALAMIN 1000 MCG/ML IJ SOLN
1000.0000 ug | Freq: Once | INTRAMUSCULAR | Status: AC
Start: 2022-12-28 — End: 2022-12-28
  Administered 2022-12-28: 1000 ug via INTRAMUSCULAR

## 2022-12-28 NOTE — Telephone Encounter (Signed)
Looked like has been added. No further action needed at this time.

## 2022-12-28 NOTE — Progress Notes (Signed)
Per orders of Dr. Ermalene Searing, in absence of Dr. Patsy Lager,  injection of Vit B12 given by Ileana Ladd.  Patient is currently doing monthly B12 injections.  Patient tolerated injection well.

## 2023-01-01 ENCOUNTER — Ambulatory Visit: Payer: Managed Care, Other (non HMO)

## 2023-01-02 ENCOUNTER — Telehealth: Payer: Self-pay

## 2023-01-02 NOTE — Telephone Encounter (Signed)
Pt saw Dr Para March recently and a letter was done to the [facility]. They are saying they did not receive it. It also needed to have her Drivers License number on it.   I copied the letter Dr Para March had done and created a new one with her NCDL # and routed it to them from her chart.

## 2023-01-22 ENCOUNTER — Encounter: Payer: Self-pay | Admitting: Internal Medicine

## 2023-01-22 ENCOUNTER — Ambulatory Visit (INDEPENDENT_AMBULATORY_CARE_PROVIDER_SITE_OTHER): Payer: Managed Care, Other (non HMO) | Admitting: Internal Medicine

## 2023-01-22 VITALS — BP 92/66 | HR 86 | Temp 97.3°F | Ht 64.0 in | Wt 141.0 lb

## 2023-01-22 DIAGNOSIS — D508 Other iron deficiency anemias: Secondary | ICD-10-CM | POA: Diagnosis not present

## 2023-01-22 DIAGNOSIS — E538 Deficiency of other specified B group vitamins: Secondary | ICD-10-CM

## 2023-01-22 DIAGNOSIS — I959 Hypotension, unspecified: Secondary | ICD-10-CM | POA: Insufficient documentation

## 2023-01-22 HISTORY — DX: Deficiency of other specified B group vitamins: E53.8

## 2023-01-22 LAB — RENAL FUNCTION PANEL
Albumin: 4.1 g/dL (ref 3.5–5.2)
BUN: 14 mg/dL (ref 6–23)
CO2: 29 mEq/L (ref 19–32)
Calcium: 9.4 mg/dL (ref 8.4–10.5)
Chloride: 104 mEq/L (ref 96–112)
Creatinine, Ser: 0.67 mg/dL (ref 0.40–1.20)
GFR: 106.52 mL/min (ref >60.00)
Glucose, Bld: 87 mg/dL (ref 70–99)
Phosphorus: 3.4 mg/dL (ref 2.3–4.6)
Potassium: 4.5 mEq/L (ref 3.5–5.1)
Sodium: 141 mEq/L (ref 135–145)

## 2023-01-22 LAB — HEPATIC FUNCTION PANEL
ALT: 14 U/L (ref 0–35)
AST: 15 U/L (ref 0–37)
Albumin: 4.1 g/dL (ref 3.5–5.2)
Alkaline Phosphatase: 56 U/L (ref 39–117)
Bilirubin, Direct: 0.1 mg/dL (ref 0.0–0.3)
Total Bilirubin: 0.6 mg/dL (ref 0.2–1.2)
Total Protein: 7.1 g/dL (ref 6.0–8.3)

## 2023-01-22 LAB — CBC
HCT: 38.6 % (ref 36.0–46.0)
Hemoglobin: 12.5 g/dL (ref 12.0–15.0)
MCHC: 32.4 g/dL (ref 30.0–36.0)
MCV: 82.4 fl (ref 78.0–100.0)
Platelets: 227 10*3/uL (ref 150.0–400.0)
RBC: 4.68 Mil/uL (ref 3.87–5.11)
RDW: 21.1 % — ABNORMAL HIGH (ref 11.5–15.5)
WBC: 4.1 10*3/uL (ref 4.0–10.5)

## 2023-01-22 LAB — IBC + FERRITIN
Ferritin: 187.8 ng/mL (ref 10.0–291.0)
Iron: 109 ug/dL (ref 42–145)
Saturation Ratios: 33.1 % (ref 20.0–50.0)
TIBC: 329 ug/dL (ref 250.0–450.0)
Transferrin: 235 mg/dL (ref 212.0–360.0)

## 2023-01-22 LAB — CORTISOL: Cortisol, Plasma: 8.8 ug/dL

## 2023-01-22 LAB — TSH: TSH: 0.61 u[IU]/mL (ref 0.35–5.50)

## 2023-01-22 LAB — HEMOGLOBIN A1C: Hgb A1c MFr Bld: 4.9 % (ref 4.6–6.5)

## 2023-01-22 LAB — VITAMIN B12: Vitamin B-12: 359 pg/mL (ref 211–911)

## 2023-01-22 NOTE — Progress Notes (Signed)
Subjective:    Patient ID: Melissa Weiss, female    DOB: Aug 12, 1980, 42 y.o.   MRN: [id number]  HPI Here due to shaky feelings in the morning  "Something is not right with me"--goes back about 3 weeks Gets numbness in lower lip and will quiver-- later in day Usually just has coffee in the morning---and has had shakiness and has needed to eat something  Now having nabs with the coffee Actually had to order [employer] at 11AM to prevent the symptoms Has dizzy feeling even after eating Gets palpitations with the funny lip feeling  On monthly B12 Had low iron--gets iron infusions from cancer center--last 3 weeks ago  Eats a good dinner Doesn't snack at night Weight fairly stable  BP has been very low lately Systolic often under 100---this is new  Current Outpatient Medications on File Prior to Visit  Medication Sig Dispense Refill   Multiple Vitamins-Minerals (MULTIVITAMIN WITH MINERALS) tablet Take 1 tablet by mouth daily.     Vitamin D, Ergocalciferol, (DRISDOL) 1.25 MG (50000 UNIT) CAPS capsule Take 1 capsule (50,000 Units total) by mouth every 7 (seven) days. 12 capsule 0   Current Facility-Administered Medications on File Prior to Visit  Medication Dose Route Frequency Provider Last Rate Last Admin   hyaluronate sodium (RADIAPLEXRX) gel   Topical Once Lurline Hare, MD       non-metallic deodorant Thornton Papas) 1 application  1 application  Topical Once Lurline Hare, MD        Allergies  Allergen Reactions   Codeine Nausea Only   Erythromycin Nausea Only   Ferrous Sulfate Nausea Only   Lidocaine Other (See Comments)    Swelling in mouth    Vicodin [Hydrocodone-Acetaminophen] Nausea And Vomiting   Epinephrine Palpitations    Past Medical History:  Diagnosis Date   Anxiety state, unspecified    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   Colitis    Colon polyp    benign   Folliculitis 3/08   pseudomonas   H/O urticaria     Lipoma of unspecified site    Neoplasm of unspecified nature of bone, soft tissue, and skin    Ovarian cyst 2000   S/P radiation therapy 04/28/2013-06/11/2013   Right breast / 45 Gray @ 1.8 Wallace Cullens per fraction x 25 fractions/Right breast boost / 16 Gray at [employer] per fraction x 8 fractions     Past Surgical History:  Procedure Laterality Date   BREAST LUMPECTOMY Bilateral    COLONOSCOPY  01/05/1999   few polyps; bx neg   ESOPHAGOGASTRODUODENOSCOPY  02/04/1999   Neg   FLEXIBLE SIGMOIDOSCOPY  10/05/1999   Neg   MASTECTOMY Right 2022   TONSILLECTOMY  08/06/1996    History reviewed. No pertinent family history.  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   Social Determinants of Health   Financial Resource Strain: Low Risk  (11/23/2022)   Overall Financial Resource Strain (CARDIA)    Difficulty of Paying Living Expenses: Not hard at all  Food Insecurity: No Food Insecurity (11/23/2022)   Hunger Vital Sign    Worried About Running Out of Food in the Last Year: Never true    Ran Out of Food in the Last Year: Never true  Transportation Needs: No Transportation Needs (11/23/2022)   PRAPARE - Administrator, Civil Service (Medical): No    Lack of Transportation (Non-Medical): No  Physical Activity: Insufficiently Active (11/23/2022)   Exercise Vital Sign    Days of Exercise per Week: 2 days    Minutes of Exercise per  Session: 30 min  Stress: No Stress Concern Present (11/23/2022)   [employer] of Occupational Health - Occupational Stress Questionnaire    Feeling of Stress : Not at all  Social Connections: Socially Integrated (11/23/2022)   Social Connection and Isolation Panel [NHANES]    Frequency of Communication with Friends and Family: More than three times a week    Frequency of Social Gatherings with Friends and Family: Once a week    Attends Religious Services: More than 4 times per year    Active Member of [employer] or Organizations: Yes    Attends Banker Meetings: 1 to 4 times per year    Marital Status: Married  Catering manager Violence: Not At Risk (04/15/2022)   Humiliation, Afraid, Rape, and Kick questionnaire    Fear of Current or Ex-Partner: No    Emotionally Abused: No    Physically Abused: No    Sexually Abused: No   Review of Systems Stays active--with sons  No chest pain or SOB No change in exercise tolerance Sleeps okay    Objective:   Physical Exam Constitutional:      Appearance: Normal appearance.  Cardiovascular:     Rate and Rhythm: Normal rate and regular rhythm.     Pulses: Normal pulses.     Heart sounds: No murmur heard.    No gallop.  Pulmonary:     Effort: Pulmonary effort is normal.     Breath sounds: Normal breath sounds. No wheezing or rales.  Abdominal:     Palpations: Abdomen is soft.     Tenderness: There is no abdominal tenderness.  Musculoskeletal:     Cervical back: Neck supple.     Right lower leg: No edema.     Left lower leg: No edema.  Lymphadenopathy:     Cervical: No cervical adenopathy.  Neurological:     Mental Status: She is alert.  Psychiatric:        Mood and Affect: Mood normal.        Behavior: Behavior normal.            Assessment & Plan:

## 2023-01-22 NOTE — Assessment & Plan Note (Signed)
Recent infusion Will check levels

## 2023-01-22 NOTE — Assessment & Plan Note (Signed)
Also having what sounds like hypoglycemic spells Orthostatic dizziness No meds No change in lifestyle, etc Only change is heavy iron infusion 3 weeks ago  Will check labs Discussed increasing fluids/salt Regular meals May need further evaluation

## 2023-01-22 NOTE — Assessment & Plan Note (Signed)
Will check levels 

## 2023-01-29 ENCOUNTER — Ambulatory Visit: Payer: Managed Care, Other (non HMO)

## 2023-02-22 ENCOUNTER — Ambulatory Visit (INDEPENDENT_AMBULATORY_CARE_PROVIDER_SITE_OTHER): Payer: Managed Care, Other (non HMO) | Admitting: *Deleted

## 2023-02-22 DIAGNOSIS — E538 Deficiency of other specified B group vitamins: Secondary | ICD-10-CM | POA: Diagnosis not present

## 2023-02-22 MED ORDER — CYANOCOBALAMIN 1000 MCG/ML IJ SOLN
1000.0000 ug | Freq: Once | INTRAMUSCULAR | Status: AC
Start: 2023-02-22 — End: 2023-02-22
  Administered 2023-02-22: 1000 ug via INTRAMUSCULAR

## 2023-02-22 NOTE — Progress Notes (Signed)
Per orders of Dr. Alphonsus Sias in the absence of Dr. Patsy Lager , injection of Vitamin B12 given in Left Deltoid by Ileana Ladd. Patient tolerated injection well.

## 2023-05-06 ENCOUNTER — Ambulatory Visit
Admission: RE | Admit: 2023-05-06 | Discharge: 2023-05-06 | Disposition: A | Discharge status: Home / Self Care | Payer: Managed Care, Other (non HMO) | Source: Ambulatory Visit | Attending: Internal Medicine | Admitting: Internal Medicine

## 2023-05-06 ENCOUNTER — Encounter: Payer: Self-pay | Admitting: Internal Medicine

## 2023-05-06 ENCOUNTER — Ambulatory Visit (INDEPENDENT_AMBULATORY_CARE_PROVIDER_SITE_OTHER): Payer: Managed Care, Other (non HMO) | Admitting: Internal Medicine

## 2023-05-06 VITALS — BP 110/70 | HR 90 | Temp 98.4°F | Ht 64.0 in | Wt 144.0 lb

## 2023-05-06 DIAGNOSIS — R051 Acute cough: Secondary | ICD-10-CM | POA: Diagnosis not present

## 2023-05-06 DIAGNOSIS — R059 Cough, unspecified: Secondary | ICD-10-CM | POA: Insufficient documentation

## 2023-05-06 LAB — POC COVID19 BINAXNOW: SARS Coronavirus 2 Ag: NEGATIVE

## 2023-05-06 LAB — POCT INFLUENZA A/B
Influenza A, POC: NEGATIVE
Influenza B, POC: NEGATIVE

## 2023-05-06 MED ORDER — LEVOFLOXACIN 500 MG PO TABS: 500.0000 mg | ORAL_TABLET | Freq: Every day | ORAL | 0 refills | Status: DC

## 2023-05-06 NOTE — Progress Notes (Signed)
Subjective:    Patient ID: Melissa Weiss, female    DOB: 20-Jan-1981, 42 y.o.   MRN: [id number]  HPI Here due to respiratory infection  Feels really sick===like with past sepsis/pneumonia Coughing up chunks of dark green mucus Also with green nasal drainage Trouble breathing Feels weak Fever up to 101--ibuprofen helps Feels like face is swollen Frontal headache Some chills Bad sore throat--- better today.  Some ear pain--feels like fluid  Tried dayquil--no help  Current Outpatient Medications on File Prior to Visit  Medication Sig Dispense Refill   Multiple Vitamins-Minerals (MULTIVITAMIN WITH MINERALS) tablet Take 1 tablet by mouth daily.     Current Facility-Administered Medications on File Prior to Visit  Medication Dose Route Frequency Provider Last Rate Last Admin   hyaluronate sodium (RADIAPLEXRX) gel   Topical Once Lurline Hare, MD       non-metallic deodorant Thornton Papas) 1 application  1 application  Topical Once Lurline Hare, MD        Allergies  Allergen Reactions   Codeine Nausea Only   Erythromycin Nausea Only   Ferrous Sulfate Nausea Only   Lidocaine Other (See Comments)    Swelling in mouth    Vicodin [Hydrocodone-Acetaminophen] Nausea And Vomiting   Epinephrine Palpitations    Past Medical History:  Diagnosis Date   Anxiety state, unspecified    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   Colitis    Colon polyp    benign   Folliculitis 3/08   pseudomonas   H/O urticaria    Lipoma of unspecified site    Neoplasm of unspecified nature of bone, soft tissue, and skin    Ovarian cyst 2000   S/P radiation therapy 04/28/2013-06/11/2013   Right breast / 45 Gray @ 1.8 Wallace Cullens per fraction x 25 fractions/Right breast boost / 16 Gray at [employer] per fraction x 8 fractions     Past Surgical History:  Procedure Laterality Date   BREAST LUMPECTOMY Bilateral    COLONOSCOPY  01/05/1999   few polyps; bx neg    ESOPHAGOGASTRODUODENOSCOPY  02/04/1999   Neg   FLEXIBLE SIGMOIDOSCOPY  10/05/1999   Neg   MASTECTOMY Right 2022   TONSILLECTOMY  08/06/1996    History reviewed. No pertinent family history.  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   Social Determinants of Health   Financial Resource Strain: Low Risk  (11/23/2022)   Overall Financial Resource Strain (CARDIA)    Difficulty of Paying Living Expenses: Not hard at all  Food Insecurity: No Food Insecurity (11/23/2022)   Hunger Vital Sign    Worried About Running Out of Food in the Last Year: Never true    Ran Out of Food in the Last Year: Never true  Transportation Needs: No Transportation Needs (11/23/2022)   PRAPARE - Transportation  Lack of Transportation (Medical): No    Lack of Transportation (Non-Medical): No  Physical Activity: Insufficiently Active (11/23/2022)   Exercise Vital Sign    Days of Exercise per Week: 2 days    Minutes of Exercise per Session: 30 min  Stress: No Stress Concern Present (11/23/2022)   [employer] of Occupational Health - Occupational Stress Questionnaire    Feeling of Stress : Not at all  Social Connections: Socially Integrated (11/23/2022)   Social Connection and Isolation Panel [NHANES]    Frequency of Communication with Friends and Family: More than three times a week    Frequency of Social Gatherings with Friends and Family: Once a  week    Attends Religious Services: More than 4 times per year    Active Member of [employer] or Organizations: Yes    Attends Banker Meetings: 1 to 4 times per year    Marital Status: Married  Catering manager Violence: Unknown (07/10/2022)   Received from [employer], [facility]   HITS    Physically Hurt: Not on file    Insult or Talk Down To: Not on file    Threaten Physical Harm: Not on file    Scream or Curse: Not on file   Review of Systems No loss of smell or taste Some nausea---appetite is off    Objective:   Physical Exam Constitutional:      Comments: Mildly ill appearing  HENT:     Head:     Comments: Sig maxillary tenderness    Mouth/Throat:     Pharynx: No oropharyngeal exudate or posterior oropharyngeal erythema.  Pulmonary:     Effort: Pulmonary effort is normal.     Breath sounds: Normal breath sounds. No wheezing or rales.  Musculoskeletal:     Cervical back: Neck supple.  Lymphadenopathy:     Cervical: No cervical adenopathy.  Neurological:     Mental Status: She is alert.            Assessment & Plan:

## 2023-05-06 NOTE — Assessment & Plan Note (Addendum)
With fever and SOB Had pneumonia last year and feels the same Certainly has sinus infection at this point as well COVID/flu negative Will check CXR--looks the same as last year. No lobar pneumonia  Given extremity of illness last year, will give levaquin 500mg  daily for 7 days. Discussed black box warning It will be fine even if just sinus infection  analgesics

## 2023-06-08 DIAGNOSIS — E611 Iron deficiency: Secondary | ICD-10-CM | POA: Insufficient documentation

## 2023-06-26 ENCOUNTER — Ambulatory Visit: Payer: Managed Care, Other (non HMO) | Admitting: Family Medicine

## 2023-06-27 ENCOUNTER — Ambulatory Visit: Payer: Managed Care, Other (non HMO) | Admitting: Internal Medicine

## 2023-06-27 ENCOUNTER — Encounter: Payer: Self-pay | Admitting: Internal Medicine

## 2023-06-27 VITALS — BP 102/60 | HR 90 | Temp 98.3°F | Ht 64.0 in | Wt 141.0 lb

## 2023-06-27 DIAGNOSIS — J0110 Acute frontal sinusitis, unspecified: Secondary | ICD-10-CM | POA: Insufficient documentation

## 2023-06-27 LAB — POC COVID19 BINAXNOW: SARS Coronavirus 2 Ag: NEGATIVE

## 2023-06-27 MED ORDER — DOXYCYCLINE HYCLATE 100 MG PO TABS: 100.0000 mg | ORAL_TABLET | Freq: Two times a day (BID) | ORAL | 0 refills | Status: DC

## 2023-06-27 NOTE — Progress Notes (Signed)
Subjective:    Patient ID: Melissa Weiss, female    DOB: 07-06-1981, 42 y.o.   MRN: [id number]  HPI Here due to respiratory infection  Did get better the last time Now sick again Now feels it more in her chest---substernal Coughing up chunks of green stuff--shows me a picture--mostly at night Thick yellow nasal discharge No energy  Started 5 days ago--with reduced appetite Worsening especially in the past 3 days Low grade fever--not over 100 Cold and hot --chills Some SOB  Tried mucinex--not helping Dimetapp Ibuprofen for evening headache  Current Outpatient Medications on File Prior to Visit  Medication Sig Dispense Refill   Multiple Vitamins-Minerals (MULTIVITAMIN WITH MINERALS) tablet Take 1 tablet by mouth daily.     Current Facility-Administered Medications on File Prior to Visit  Medication Dose Route Frequency Provider Last Rate Last Admin   hyaluronate sodium (RADIAPLEXRX) gel   Topical Once Lurline Hare, MD       non-metallic deodorant Thornton Papas) 1 application  1 application  Topical Once Lurline Hare, MD        Allergies  Allergen Reactions   Codeine Nausea Only   Erythromycin Nausea Only   Ferrous Sulfate Nausea Only   Lidocaine Other (See Comments)    Swelling in mouth    Vicodin [Hydrocodone-Acetaminophen] Nausea And Vomiting   Epinephrine Palpitations    Past Medical History:  Diagnosis Date   Anxiety state, unspecified    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   Colitis    Colon polyp    benign   Folliculitis 3/08   pseudomonas   H/O urticaria    Lipoma of unspecified site    Neoplasm of unspecified nature of bone, soft tissue, and skin    Ovarian cyst 2000   S/P radiation therapy 04/28/2013-06/11/2013   Right breast / 45 Gray @ 1.8 Wallace Cullens per fraction x 25 fractions/Right breast boost / 16 Gray at [employer] per fraction x 8 fractions     Past Surgical History:  Procedure Laterality Date   BREAST  LUMPECTOMY Bilateral    COLONOSCOPY  01/05/1999   few polyps; bx neg   ESOPHAGOGASTRODUODENOSCOPY  02/04/1999   Neg   FLEXIBLE SIGMOIDOSCOPY  10/05/1999   Neg   MASTECTOMY Right 2022   TONSILLECTOMY  08/06/1996    History reviewed. No pertinent family history.  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   Social Determinants of Health   Financial Resource Strain: Low Risk  (11/23/2022)   Overall Financial Resource Strain (CARDIA)    Difficulty of Paying Living Expenses: Not hard at all  Food Insecurity: No Food Insecurity (11/23/2022)   Hunger Vital Sign    Worried About Running Out of Food in the Last Year: Never true    Ran Out of Food in the Last  Year: Never true  Transportation Needs: No Transportation Needs (11/23/2022)   PRAPARE - Administrator, Civil Service (Medical): No    Lack of Transportation (Non-Medical): No  Physical Activity: Insufficiently Active (11/23/2022)   Exercise Vital Sign    Days of Exercise per Week: 2 days    Minutes of Exercise per Session: 30 min  Stress: No Stress Concern Present (11/23/2022)   [employer] of Occupational Health - Occupational Stress Questionnaire    Feeling of Stress : Not at all  Social Connections: Socially Integrated (11/23/2022)   Social Connection and Isolation Panel [NHANES]    Frequency of Communication with Friends and Family: More than three times a  week    Frequency of Social Gatherings with Friends and Family: Once a week    Attends Religious Services: More than 4 times per year    Active Member of [employer] or Organizations: Yes    Attends Banker Meetings: 1 to 4 times per year    Marital Status: Married  Catering manager Violence: Unknown (07/10/2022)   Received from [facility], [facility]   HITS    Physically Hurt: Not on file    Insult or Talk Down To: Not on file    Threaten Physical Harm: Not on file    Scream or Curse: Not on file   Review of Systems Not much cough Nausea only with the cough. No vomiting Back is achy Not eating much     Objective:   Physical Exam Constitutional:      Appearance: Normal appearance.  HENT:     Head:     Comments: Mild frontal tenderness    Right Ear: Tympanic membrane and ear canal normal.     Left Ear: Tympanic membrane and ear canal normal.     Mouth/Throat:     Pharynx: No oropharyngeal exudate or posterior oropharyngeal erythema.  Pulmonary:     Effort: Pulmonary effort is normal.     Breath sounds: Normal breath sounds. No wheezing or rales.  Musculoskeletal:     Cervical back: Neck supple.  Lymphadenopathy:     Cervical: No cervical adenopathy.  Neurological:     Mental Status: She is alert.            Assessment & Plan:

## 2023-06-27 NOTE — Assessment & Plan Note (Addendum)
Doesn't seem to be pulmonary COVID negative here Will treat with doxy 100 bid x 7 days analgesics

## 2023-06-27 NOTE — Addendum Note (Signed)
Addended by: Eual Fines on: 06/27/2023 09:04 AM   Modules accepted: Orders

## 2023-07-24 ENCOUNTER — Ambulatory Visit: Payer: Managed Care, Other (non HMO) | Admitting: Family Medicine

## 2024-01-09 DIAGNOSIS — C50911 Malignant neoplasm of unspecified site of right female breast: Secondary | ICD-10-CM | POA: Diagnosis not present

## 2024-01-09 DIAGNOSIS — Z862 Personal history of diseases of the blood and blood-forming organs and certain disorders involving the immune mechanism: Secondary | ICD-10-CM | POA: Diagnosis not present

## 2024-01-09 DIAGNOSIS — R0602 Shortness of breath: Secondary | ICD-10-CM | POA: Diagnosis not present

## 2024-01-09 DIAGNOSIS — Z170 Estrogen receptor positive status [ER+]: Secondary | ICD-10-CM | POA: Diagnosis not present

## 2024-01-09 DIAGNOSIS — N6321 Unspecified lump in the left breast, upper outer quadrant: Secondary | ICD-10-CM | POA: Diagnosis not present

## 2024-01-09 DIAGNOSIS — N6324 Unspecified lump in the left breast, lower inner quadrant: Secondary | ICD-10-CM | POA: Diagnosis not present

## 2024-01-09 DIAGNOSIS — Z9011 Acquired absence of right breast and nipple: Secondary | ICD-10-CM | POA: Diagnosis not present

## 2024-01-09 DIAGNOSIS — N6012 Diffuse cystic mastopathy of left breast: Secondary | ICD-10-CM | POA: Diagnosis not present

## 2024-01-09 DIAGNOSIS — R059 Cough, unspecified: Secondary | ICD-10-CM | POA: Diagnosis not present

## 2024-01-09 DIAGNOSIS — E611 Iron deficiency: Secondary | ICD-10-CM | POA: Diagnosis not present

## 2024-01-09 LAB — HM MAMMOGRAPHY

## 2024-02-19 DIAGNOSIS — Z1721 Progesterone receptor positive status: Secondary | ICD-10-CM | POA: Diagnosis not present

## 2024-02-19 DIAGNOSIS — Z1231 Encounter for screening mammogram for malignant neoplasm of breast: Secondary | ICD-10-CM | POA: Diagnosis not present

## 2024-02-19 DIAGNOSIS — Z853 Personal history of malignant neoplasm of breast: Secondary | ICD-10-CM | POA: Diagnosis not present

## 2024-02-19 DIAGNOSIS — Z01411 Encounter for gynecological examination (general) (routine) with abnormal findings: Secondary | ICD-10-CM | POA: Diagnosis not present

## 2024-02-19 DIAGNOSIS — Z170 Estrogen receptor positive status [ER+]: Secondary | ICD-10-CM | POA: Diagnosis not present

## 2024-02-19 DIAGNOSIS — R87610 Atypical squamous cells of undetermined significance on cytologic smear of cervix (ASC-US): Secondary | ICD-10-CM | POA: Diagnosis not present

## 2024-02-19 DIAGNOSIS — D500 Iron deficiency anemia secondary to blood loss (chronic): Secondary | ICD-10-CM | POA: Diagnosis not present

## 2024-02-19 DIAGNOSIS — Z01419 Encounter for gynecological examination (general) (routine) without abnormal findings: Secondary | ICD-10-CM | POA: Diagnosis not present

## 2024-02-19 DIAGNOSIS — R8290 Unspecified abnormal findings in urine: Secondary | ICD-10-CM | POA: Diagnosis not present

## 2024-02-19 DIAGNOSIS — Z975 Presence of (intrauterine) contraceptive device: Secondary | ICD-10-CM | POA: Diagnosis not present

## 2024-02-19 DIAGNOSIS — N920 Excessive and frequent menstruation with regular cycle: Secondary | ICD-10-CM | POA: Diagnosis not present

## 2024-02-19 DIAGNOSIS — R87810 Cervical high risk human papillomavirus (HPV) DNA test positive: Secondary | ICD-10-CM | POA: Diagnosis not present

## 2024-02-19 DIAGNOSIS — Z9011 Acquired absence of right breast and nipple: Secondary | ICD-10-CM | POA: Diagnosis not present

## 2024-02-19 DIAGNOSIS — R399 Unspecified symptoms and signs involving the genitourinary system: Secondary | ICD-10-CM | POA: Diagnosis not present

## 2024-02-19 DIAGNOSIS — Z9889 Other specified postprocedural states: Secondary | ICD-10-CM | POA: Diagnosis not present

## 2024-04-22 DIAGNOSIS — H10413 Chronic giant papillary conjunctivitis, bilateral: Secondary | ICD-10-CM | POA: Diagnosis not present

## 2024-05-12 ENCOUNTER — Encounter: Payer: Self-pay | Admitting: Family Medicine

## 2024-05-12 NOTE — Progress Notes (Unsigned)
     Melissa Fitting T. Melissa Gotwalt, MD, CAQ Sports Medicine [facility] at [facility] [address] [place], [address]  Phone: [phone]  FAX: [phone]  [name] - 43 y.o. female  MRN [id number]  Date of Birth: 1980-12-20  Date: 05/13/2024  PCP: Watt Mirza, MD  Referral: Watt Mirza, MD  No chief complaint on file.  Subjective:   Melissa Weiss is a 43 y.o. very pleasant female patient with There is no height or weight on file to calculate BMI. who presents with the following:  Discussed the use of AI scribe software for clinical note transcription with the patient, who gave verbal consent to proceed.  I actually have not seen Melissa Weiss in about 18 months, but she is here to discuss some DMV paperwork. History of Present Illness     Review of Systems is noted in the HPI, as appropriate  Objective:   There were no vitals taken for this visit.  GEN: No acute distress; alert,appropriate. PULM: Breathing comfortably in no respiratory distress PSYCH: Normally interactive.   Laboratory and Imaging Data:  Assessment and Plan:   No diagnosis found. Assessment & Plan   Medication Management during today's office visit: No orders of the defined types were placed in this encounter.  There are no discontinued medications.  Orders placed today for conditions managed today: No orders of the defined types were placed in this encounter.   Disposition: No follow-ups on file.  [employer] One speech-to-text software was used for transcription in this dictation.  Possible transcriptional errors can occur using Animal nutritionist.   Signed,  Mirza DASEN. Shivank Pinedo, MD   Outpatient Encounter Medications as of 05/13/2024  Medication Sig   doxycycline  (VIBRA -TABS) 100 MG tablet Take 1 tablet (100 mg total) by mouth 2 (two) times daily.   Multiple Vitamins-Minerals (MULTIVITAMIN WITH MINERALS) tablet Take 1 tablet by mouth daily.    Facility-Administered Encounter Medications as of 05/13/2024  Medication   hyaluronate sodium (RADIAPLEXRX) gel   non-metallic deodorant (ALRA) 1 application

## 2024-05-13 ENCOUNTER — Encounter: Payer: Self-pay | Admitting: Family Medicine

## 2024-05-13 ENCOUNTER — Ambulatory Visit: Payer: Self-pay | Admitting: Family Medicine

## 2024-05-13 DIAGNOSIS — R350 Frequency of micturition: Secondary | ICD-10-CM | POA: Diagnosis not present

## 2024-05-13 DIAGNOSIS — Z853 Personal history of malignant neoplasm of breast: Secondary | ICD-10-CM

## 2024-05-13 DIAGNOSIS — E538 Deficiency of other specified B group vitamins: Secondary | ICD-10-CM | POA: Diagnosis not present

## 2024-05-13 DIAGNOSIS — D509 Iron deficiency anemia, unspecified: Secondary | ICD-10-CM

## 2024-05-13 DIAGNOSIS — Z1159 Encounter for screening for other viral diseases: Secondary | ICD-10-CM

## 2024-05-13 LAB — POC URINALSYSI DIPSTICK (AUTOMATED)
Bilirubin, UA: NEGATIVE
Glucose, UA: NEGATIVE
Nitrite, UA: NEGATIVE
Protein, UA: NEGATIVE
Spec Grav, UA: 1.01 (ref 1.010–1.025)
Urobilinogen, UA: 0.2 U/dL
pH, UA: 6 (ref 5.0–8.0)

## 2024-05-13 MED ORDER — SULFAMETHOXAZOLE-TRIMETHOPRIM 800-160 MG PO TABS: 1.0000 | ORAL_TABLET | Freq: Two times a day (BID) | ORAL | 0 refills | Status: AC

## 2024-05-14 ENCOUNTER — Encounter: Payer: Self-pay | Admitting: Family Medicine

## 2024-05-14 LAB — CBC WITH DIFFERENTIAL/PLATELET
Basophils Absolute: 0 K/uL (ref 0.0–0.1)
Basophils Relative: 0.7 % (ref 0.0–3.0)
Eosinophils Absolute: 0.1 K/uL (ref 0.0–0.7)
Eosinophils Relative: 1.4 % (ref 0.0–5.0)
HCT: 39.3 % (ref 36.0–46.0)
Hemoglobin: 13.5 g/dL (ref 12.0–15.0)
Lymphocytes Relative: 16 % (ref 12.0–46.0)
Lymphs Abs: 1 K/uL (ref 0.7–4.0)
MCHC: 34.3 g/dL (ref 30.0–36.0)
MCV: 88.7 fl (ref 78.0–100.0)
Monocytes Absolute: 0.5 K/uL (ref 0.1–1.0)
Monocytes Relative: 7 % (ref 3.0–12.0)
Neutro Abs: 4.9 K/uL (ref 1.4–7.7)
Neutrophils Relative %: 74.9 % (ref 43.0–77.0)
Platelets: 229 K/uL (ref 150.0–400.0)
RBC: 4.43 Mil/uL (ref 3.87–5.11)
RDW: 14.1 % (ref 11.5–15.5)
WBC: 6.5 K/uL (ref 4.0–10.5)

## 2024-05-14 LAB — HEPATITIS C ANTIBODY: Hepatitis C Ab: NONREACTIVE

## 2024-05-14 LAB — IBC + FERRITIN
Ferritin: 7.9 ng/mL — ABNORMAL LOW (ref 10.0–291.0)
Iron: 55 ug/dL (ref 42–145)
Saturation Ratios: 13.1 % — ABNORMAL LOW (ref 20.0–50.0)
TIBC: 421.4 ug/dL (ref 250.0–450.0)
Transferrin: 301 mg/dL (ref 212.0–360.0)

## 2024-05-14 LAB — URINE CULTURE
MICRO NUMBER:: 17073279
Result:: NO GROWTH
SPECIMEN QUALITY:: ADEQUATE

## 2024-05-14 LAB — VITAMIN B12: Vitamin B-12: 281 pg/mL (ref 211–911)

## 2024-05-15 ENCOUNTER — Ambulatory Visit: Payer: Self-pay | Admitting: Family Medicine

## 2024-05-15 DIAGNOSIS — R3129 Other microscopic hematuria: Secondary | ICD-10-CM

## 2024-05-18 ENCOUNTER — Telehealth: Payer: Self-pay | Admitting: Family Medicine

## 2024-05-18 NOTE — Telephone Encounter (Signed)
 Copied from CRM [id number]. Topic: Clinical - Medication Question >> May 18, 2024  1:15 PM Ahlexyia S wrote: Reason for CRM: Pt is wanting to be prescribe a wrinkle cream for her face. It is called Tazarotene cream. Pt is requesting a callback when this is done.

## 2024-05-19 MED ORDER — TRETINOIN 0.1 % EX CREA: TOPICAL_CREAM | Freq: Every day | CUTANEOUS | 2 refills | Status: AC

## 2024-05-19 NOTE — Addendum Note (Signed)
 Addended by: WATT MIRZA on: 05/19/2024 12:25 PM   Modules accepted: Orders

## 2024-05-21 ENCOUNTER — Telehealth: Payer: Self-pay

## 2024-05-21 ENCOUNTER — Other Ambulatory Visit (HOSPITAL_COMMUNITY): Payer: Self-pay

## 2024-05-21 NOTE — Telephone Encounter (Signed)
 Pharmacy Patient Advocate Encounter   Received notification from Onbase that prior authorization for Tretinoin 0.1% cream is required/requested.   Insurance verification completed.   The patient is insured through [facility].   Per test claim: Per test claim, medication is not covered due to plan/benefit exclusion, PA not submitted at this time

## 2024-06-04 DIAGNOSIS — Z8701 Personal history of pneumonia (recurrent): Secondary | ICD-10-CM | POA: Diagnosis not present

## 2024-06-04 DIAGNOSIS — R059 Cough, unspecified: Secondary | ICD-10-CM | POA: Diagnosis not present

## 2024-06-04 DIAGNOSIS — Z862 Personal history of diseases of the blood and blood-forming organs and certain disorders involving the immune mechanism: Secondary | ICD-10-CM | POA: Diagnosis not present

## 2024-06-04 DIAGNOSIS — Z08 Encounter for follow-up examination after completed treatment for malignant neoplasm: Secondary | ICD-10-CM | POA: Diagnosis not present

## 2024-06-04 DIAGNOSIS — R0602 Shortness of breath: Secondary | ICD-10-CM | POA: Diagnosis not present

## 2024-06-04 DIAGNOSIS — C50911 Malignant neoplasm of unspecified site of right female breast: Secondary | ICD-10-CM | POA: Diagnosis not present

## 2024-06-04 DIAGNOSIS — Z853 Personal history of malignant neoplasm of breast: Secondary | ICD-10-CM | POA: Diagnosis not present

## 2024-06-04 DIAGNOSIS — Z9011 Acquired absence of right breast and nipple: Secondary | ICD-10-CM | POA: Diagnosis not present

## 2024-06-04 DIAGNOSIS — Z23 Encounter for immunization: Secondary | ICD-10-CM | POA: Diagnosis not present

## 2024-06-15 DIAGNOSIS — J069 Acute upper respiratory infection, unspecified: Secondary | ICD-10-CM | POA: Diagnosis not present

## 2024-06-15 DIAGNOSIS — R0981 Nasal congestion: Secondary | ICD-10-CM | POA: Diagnosis not present

## 2024-06-15 DIAGNOSIS — B9689 Other specified bacterial agents as the cause of diseases classified elsewhere: Secondary | ICD-10-CM | POA: Diagnosis not present

## 2024-07-16 DIAGNOSIS — D500 Iron deficiency anemia secondary to blood loss (chronic): Secondary | ICD-10-CM | POA: Diagnosis not present

## 2024-07-16 DIAGNOSIS — R5383 Other fatigue: Secondary | ICD-10-CM | POA: Diagnosis not present

## 2024-07-16 DIAGNOSIS — E538 Deficiency of other specified B group vitamins: Secondary | ICD-10-CM | POA: Diagnosis not present

## 2024-07-16 DIAGNOSIS — Z79899 Other long term (current) drug therapy: Secondary | ICD-10-CM | POA: Diagnosis not present

## 2024-07-16 DIAGNOSIS — E611 Iron deficiency: Secondary | ICD-10-CM | POA: Diagnosis not present
